# Patient Record
Sex: Male | Born: 1970
Health system: Southern US, Community
[De-identification: ages and names within clinical notes are randomized; demographics above are authoritative.]

## PROBLEM LIST (undated history)

## (undated) DIAGNOSIS — K219 Gastro-esophageal reflux disease without esophagitis: Secondary | ICD-10-CM

## (undated) DIAGNOSIS — E785 Hyperlipidemia, unspecified: Secondary | ICD-10-CM

## (undated) DIAGNOSIS — IMO0002 Reserved for concepts with insufficient information to code with codable children: Secondary | ICD-10-CM

## (undated) HISTORY — DX: Gastro-esophageal reflux disease without esophagitis: K21.9

## (undated) HISTORY — PX: FOOT SURGERY: SHX648

## (undated) HISTORY — DX: Reserved for concepts with insufficient information to code with codable children: IMO0002

## (undated) HISTORY — DX: Hyperlipidemia, unspecified: E78.5

---

## 2004-05-06 ENCOUNTER — Emergency Department (HOSPITAL_COMMUNITY): Admission: EM | Admit: 2004-05-06 | Discharge: 2004-05-06 | Payer: Self-pay | Admitting: Emergency Medicine

## 2010-10-29 ENCOUNTER — Encounter (HOSPITAL_BASED_OUTPATIENT_CLINIC_OR_DEPARTMENT_OTHER): Payer: Self-pay

## 2011-07-06 ENCOUNTER — Emergency Department (HOSPITAL_COMMUNITY): Payer: No Typology Code available for payment source

## 2011-07-06 ENCOUNTER — Encounter (HOSPITAL_COMMUNITY): Payer: Self-pay | Admitting: Emergency Medicine

## 2011-07-06 ENCOUNTER — Emergency Department (HOSPITAL_COMMUNITY)
Admission: EM | Admit: 2011-07-06 | Discharge: 2011-07-07 | Disposition: A | Discharge status: Home / Self Care | Payer: No Typology Code available for payment source | Attending: Emergency Medicine | Admitting: Emergency Medicine

## 2011-07-06 DIAGNOSIS — R071 Chest pain on breathing: Secondary | ICD-10-CM | POA: Diagnosis not present

## 2011-07-06 DIAGNOSIS — IMO0002 Reserved for concepts with insufficient information to code with codable children: Secondary | ICD-10-CM | POA: Diagnosis not present

## 2011-07-06 DIAGNOSIS — S2220XA Unspecified fracture of sternum, initial encounter for closed fracture: Secondary | ICD-10-CM | POA: Diagnosis not present

## 2011-07-06 DIAGNOSIS — R0789 Other chest pain: Secondary | ICD-10-CM

## 2011-07-06 DIAGNOSIS — R1084 Generalized abdominal pain: Secondary | ICD-10-CM | POA: Diagnosis not present

## 2011-07-06 DIAGNOSIS — R110 Nausea: Secondary | ICD-10-CM | POA: Insufficient documentation

## 2011-07-06 DIAGNOSIS — M79609 Pain in unspecified limb: Secondary | ICD-10-CM | POA: Diagnosis not present

## 2011-07-06 DIAGNOSIS — M25539 Pain in unspecified wrist: Secondary | ICD-10-CM | POA: Diagnosis present

## 2011-07-06 LAB — DIFFERENTIAL
Eosinophils Relative: 2 % (ref 0–5)
Lymphocytes Relative: 31 % (ref 12–46)
Lymphs Abs: 3.1 10*3/uL (ref 0.7–4.0)
Monocytes Absolute: 0.7 10*3/uL (ref 0.1–1.0)

## 2011-07-06 LAB — POCT I-STAT, CHEM 8
Calcium, Ion: 0.99 mmol/L — ABNORMAL LOW (ref 1.12–1.32)
Glucose, Bld: 113 mg/dL — ABNORMAL HIGH (ref 70–99)
HCT: 44 % (ref 39.0–52.0)
TCO2: 18 mmol/L (ref 0–100)

## 2011-07-06 LAB — CBC
HCT: 41.3 % (ref 39.0–52.0)
MCV: 85.5 fL (ref 78.0–100.0)
Platelets: 230 10*3/uL (ref 150–400)
RBC: 4.83 MIL/uL (ref 4.22–5.81)
WBC: 9.9 10*3/uL (ref 4.0–10.5)

## 2011-07-06 MED ORDER — ONDANSETRON HCL 4 MG/2ML IJ SOLN
INTRAMUSCULAR | Status: AC
  Administered 2011-07-06: 23:00:00
  Filled 2011-07-06: qty 2

## 2011-07-06 MED ORDER — MORPHINE SULFATE 4 MG/ML IJ SOLN
4.0000 mg | Freq: Once | INTRAMUSCULAR | Status: AC
  Administered 2011-07-06: 4 mg via INTRAVENOUS
  Filled 2011-07-06: qty 1

## 2011-07-06 MED ORDER — SODIUM CHLORIDE 0.9 % IV SOLN
Freq: Once | INTRAVENOUS | Status: AC
  Administered 2011-07-06: via INTRAVENOUS

## 2011-07-06 MED ORDER — SODIUM CHLORIDE 0.9 % IV BOLUS (SEPSIS)
1000.0000 mL | Freq: Once | INTRAVENOUS | Status: AC
  Administered 2011-07-06: 1000 mL via INTRAVENOUS
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
## 2011-07-07 ENCOUNTER — Emergency Department (HOSPITAL_COMMUNITY): Payer: No Typology Code available for payment source

## 2011-07-07 DIAGNOSIS — R071 Chest pain on breathing: Secondary | ICD-10-CM | POA: Diagnosis not present

## 2011-07-07 LAB — URINALYSIS, ROUTINE W REFLEX MICROSCOPIC
Bilirubin Urine: NEGATIVE
Glucose, UA: NEGATIVE mg/dL
Hgb urine dipstick: NEGATIVE
Specific Gravity, Urine: 1.02 (ref 1.005–1.030)
pH: 5.5 (ref 5.0–8.0)

## 2011-07-07 LAB — LACTIC ACID, PLASMA: Lactic Acid, Venous: 2 mmol/L (ref 0.5–2.2)

## 2011-07-07 LAB — RAPID URINE DRUG SCREEN, HOSP PERFORMED
Amphetamines: NOT DETECTED
Barbiturates: NOT DETECTED
Benzodiazepines: NOT DETECTED
Cocaine: NOT DETECTED
Tetrahydrocannabinol: NOT DETECTED

## 2011-07-07 LAB — COMPREHENSIVE METABOLIC PANEL
ALT: 50 U/L (ref 0–53)
Alkaline Phosphatase: 70 U/L (ref 39–117)
CO2: 21 mEq/L (ref 19–32)
Calcium: 9.2 mg/dL (ref 8.4–10.5)
GFR calc Af Amer: >90 mL/min (ref >90)
GFR calc non Af Amer: >90 mL/min (ref >90)
Glucose, Bld: 105 mg/dL — ABNORMAL HIGH (ref 70–99)
Sodium: 141 mEq/L (ref 135–145)
Total Bilirubin: 0.3 mg/dL (ref 0.3–1.2)

## 2011-07-07 MED ORDER — HYDROMORPHONE HCL PF 1 MG/ML IJ SOLN
1.0000 mg | Freq: Once | INTRAMUSCULAR | Status: AC
  Administered 2011-07-07: 1 mg via INTRAVENOUS
  Filled 2011-07-07: qty 1

## 2011-07-07 MED ORDER — IBUPROFEN 800 MG PO TABS: 800.0000 mg | ORAL_TABLET | Freq: Three times a day (TID) | ORAL | Status: AC

## 2011-07-07 MED ORDER — HYDROMORPHONE HCL PF 1 MG/ML IJ SOLN
INTRAMUSCULAR | Status: AC
  Filled 2011-07-07: qty 1

## 2011-07-07 MED ORDER — HYDROCODONE-ACETAMINOPHEN 5-500 MG PO TABS: 1.0000 | ORAL_TABLET | Freq: Four times a day (QID) | ORAL | Status: AC | PRN

## 2011-07-07 MED ORDER — IOHEXOL 300 MG/ML  SOLN
100.0000 mL | Freq: Once | INTRAMUSCULAR | Status: AC | PRN
  Administered 2011-07-07: 100 mL via INTRAVENOUS

## 2011-07-07 MED ORDER — HYDROMORPHONE HCL PF 1 MG/ML IJ SOLN
0.5000 mg | INTRAMUSCULAR | Status: DC
  Administered 2011-07-07: 0.5 mg via INTRAVENOUS
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
## 2011-07-07 NOTE — ED Notes (Signed)
Patient continues with pain in chest and wrist.  MD notified.  New orders per Dr Dierdre Highman.
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
## 2011-07-07 NOTE — ED Notes (Signed)
Patient ambulating independently, pain is better while up and walking.

## 2011-07-08 NOTE — ED Provider Notes (Signed)
I saw and evaluated the patient, reviewed the resident's note and I agree with the findings and plan.   .Face to face Exam:  General:  Awake HEENT:  Atraumatic Resp:  Normal effort Abd:  Nondistended Neuro:No focal weakness Lymph: No adenopathy   Nelia Shi, MD 07/08/11 [phone]

## 2012-06-20 ENCOUNTER — Ambulatory Visit (INDEPENDENT_AMBULATORY_CARE_PROVIDER_SITE_OTHER): Payer: BC Managed Care – PPO | Admitting: Nurse Practitioner

## 2012-06-20 VITALS — BP 126/84 | HR 72 | Temp 98.5°F | Ht 69.0 in | Wt 170.0 lb

## 2012-06-20 DIAGNOSIS — E785 Hyperlipidemia, unspecified: Secondary | ICD-10-CM

## 2012-06-20 DIAGNOSIS — J020 Streptococcal pharyngitis: Secondary | ICD-10-CM

## 2012-06-20 DIAGNOSIS — Z139 Encounter for screening, unspecified: Secondary | ICD-10-CM

## 2012-06-20 LAB — COMPLETE METABOLIC PANEL WITH GFR
CO2: 20 mEq/L (ref 19–32)
Creat: 0.8 mg/dL (ref 0.50–1.35)
GFR, Est African American: >89 mL/min
GFR, Est Non African American: >89 mL/min
Glucose, Bld: 91 mg/dL (ref 70–99)
Total Bilirubin: 0.5 mg/dL (ref 0.3–1.2)

## 2012-06-20 LAB — PSA: PSA: 0.52 ng/mL (ref <=4.00)

## 2012-06-20 NOTE — Progress Notes (Signed)
  Subjective:    Patient ID: Kurt Bowen, male    DOB: 1970/04/10, 42 y.o.   MRN: [id number]  Sore Throat  This is a new problem. The current episode started in the past 7 days. The problem has been gradually worsening. Neither side of throat is experiencing more pain than the other. There has been no fever. The pain is at a severity of 8/10. The pain is severe. Associated symptoms include headaches and trouble swallowing. Pertinent negatives include no congestion, coughing or shortness of breath. He has had no exposure to strep. He has tried nothing for the symptoms.  Hyperlipidemia This is a chronic problem. The current episode started more than 1 month ago. There are no known factors aggravating his hyperlipidemia. Pertinent negatives include no chest pain, leg pain or shortness of breath. He is currently on no antihyperlipidemic treatment. Compliance problems include adherence to diet and adherence to exercise.  Risk factors for coronary artery disease include male sex.      Review of Systems  Constitutional: Negative.   HENT: Positive for trouble swallowing. Negative for congestion.   Eyes: Negative.   Respiratory: Negative for cough and shortness of breath.   Cardiovascular: Negative for chest pain.  Genitourinary: Negative.   Skin: Negative.   Neurological: Positive for headaches.       Objective:   Physical Exam  Constitutional: He appears well-developed and well-nourished.  HENT:  Head: Normocephalic and atraumatic.  Right Ear: External ear normal.  Left Ear: External ear normal.  Eyes: Conjunctivae and EOM are normal. Pupils are equal, round, and reactive to light.  Neck: Normal range of motion. Neck supple. No JVD present. Carotid bruit is not present.  Cardiovascular: Normal rate, regular rhythm and normal heart sounds.   No murmur heard. Pulmonary/Chest: Effort normal and breath sounds normal.  Abdominal: Soft. Bowel sounds are normal.    BP 126/84  Pulse 72   Temp(Src) 98.5 F (36.9 C) (Oral)  Ht 5\' 9"  (1.753 m)  Wt 170 lb (77.111 kg)  BMI 25.09 kg/m2   Results for orders placed in visit on 06/20/12  POCT RAPID STREP A (OFFICE)      Result Value Range   Rapid Strep A Screen Negative  Negative       Assessment & Plan:  1. Pharyngitis Force fluids Motrin or tylenol OTC OTC decongestant Throat lozenges if help New toothbrush in 3 days  - POCT rapid strep A  2. Hyperlipidemia with target LDL less than 100 Low fat diet and exercise Labs pending   3. Screening PSA pending  Mary-Margaret Daphine Deutscher, FNP

## 2012-06-20 NOTE — Patient Instructions (Signed)

## 2012-06-21 ENCOUNTER — Telehealth: Payer: Self-pay | Admitting: Nurse Practitioner

## 2012-06-21 ENCOUNTER — Other Ambulatory Visit: Payer: Self-pay | Admitting: Physician Assistant

## 2012-06-21 DIAGNOSIS — J322 Chronic ethmoidal sinusitis: Secondary | ICD-10-CM

## 2012-06-21 MED ORDER — CIPROFLOXACIN HCL 500 MG PO TABS: 500.0000 mg | ORAL_TABLET | Freq: Two times a day (BID) | ORAL | Status: DC

## 2012-06-21 NOTE — Telephone Encounter (Signed)
Pt.notified

## 2012-06-21 NOTE — Telephone Encounter (Signed)
cipro sent to pharm

## 2012-06-21 NOTE — Telephone Encounter (Signed)
Please advise 

## 2012-06-24 ENCOUNTER — Telehealth: Payer: Self-pay | Admitting: Nurse Practitioner

## 2012-06-24 ENCOUNTER — Other Ambulatory Visit: Payer: Self-pay | Admitting: Nurse Practitioner

## 2012-06-24 LAB — NMR LIPOPROFILE WITH LIPIDS
HDL Size: 8.2 nm — ABNORMAL LOW (ref >=9.2)
HDL-C: 35 mg/dL — ABNORMAL LOW (ref >=40)
LDL (calc): 116 mg/dL — ABNORMAL HIGH (ref <100)
LDL Particle Number: 2257 nmol/L — ABNORMAL HIGH (ref <1000)
LDL Size: 19.7 nm — ABNORMAL LOW (ref >20.5)
LP-IR Score: 57 — ABNORMAL HIGH (ref <=45)

## 2012-06-24 MED ORDER — ATORVASTATIN CALCIUM 40 MG PO TABS: 40.0000 mg | ORAL_TABLET | Freq: Every day | ORAL | Status: DC

## 2012-06-24 NOTE — Progress Notes (Signed)
Patient aware of labs and that rx was sent to the pharmacy

## 2012-06-24 NOTE — Telephone Encounter (Signed)
Taken care of labs were just called into patient the provider had sent rx

## 2012-10-02 ENCOUNTER — Telehealth: Payer: Self-pay | Admitting: Family Medicine

## 2012-10-03 NOTE — Telephone Encounter (Signed)
Appt given

## 2012-10-10 ENCOUNTER — Ambulatory Visit (INDEPENDENT_AMBULATORY_CARE_PROVIDER_SITE_OTHER): Payer: BC Managed Care – PPO | Admitting: *Deleted

## 2012-10-10 DIAGNOSIS — Z23 Encounter for immunization: Secondary | ICD-10-CM

## 2012-10-10 NOTE — Patient Instructions (Signed)
Meningococcal Diphtheria Toxoid Conjugate Vaccine What is this medicine? MENINGOCOCCAL DIPHTHERIA TOXOID CONJUGATE VACCINE (muh ning goh KOK kal dif THEER ee uh TOK soid KON juh geyt vak SEEN) is a vaccine to protect from bacterial meningitis. This vaccine does not contain live bacteria. It will not cause a meningitis. This medicine may be used for other purposes; ask your health care provider or pharmacist if you have questions. What should I tell my health care provider before I take this medicine? They need to know if you have any of these conditions: -bleeding disorder -fever or infection -history of Guillain-Barre syndrome -immune system problems -an unusual or allergic reaction to diphtheria toxoid, meningococcal vaccine, latex, other medicines, foods, dyes, or preservatives -pregnant or trying to get pregnant -breast-feeding How should I use this medicine? This medicine is for injection into a muscle. It is given by a health care professional in a hospital or clinic setting. A copy of Vaccine Information Statements will be given before each vaccination. Read this sheet carefully each time. The sheet may change frequently. Talk to your pediatrician regarding the use of this medicine in children. While some brands of this drug may be prescribed for children as young as 61 months of age for selected conditions, precautions do apply. Overdosage: If you think you have taken too much of this medicine contact a poison control center or emergency room at once. NOTE: This medicine is only for you. Do not share this medicine with others. What if I miss a dose? This does not apply. What may interact with this medicine? -adalimumab -anakinra -infliximab -medicines for organ transplant -medicines to treat cancer -medicines used during some procedures to diagnose a medical condition -other vaccines -some medicines for arthritis -steroid medicines like prednisone or cortisone This list may not  describe all possible interactions. Give your health care provider a list of all the medicines, herbs, non-prescription drugs, or dietary supplements you use. Also tell them if you smoke, drink alcohol, or use illegal drugs. Some items may interact with your medicine. What should I watch for while using this medicine? Report any side effects that are worrisome to your doctor right away. Call your doctor if you have any unusual symptoms within 6 weeks of getting this vaccine. This vaccine may not protect from all meningitis infections. Women should inform their doctor if they wish to become pregnant or think they might be pregnant. Talk to your health care professional or pharmacist for more information. What side effects may I notice from receiving this medicine? Side effects that you should report to your doctor or health care professional as soon as possible: -allergic reactions like skin rash, itching or hives, swelling of the face, lips, or tongue -breathing problems -feeling faint or lightheaded, falls -fever over 102 degrees F -muscle weakness -unusual drooping or paralysis of face  Side effects that usually do not require medical attention (report to your doctor or health care professional if they continue or are bothersome): -chills -diarrhea -headache -loss of appetite -muscle aches and pains -pain at site where injected -tired This list may not describe all possible side effects. Call your doctor for medical advice about side effects. You may report side effects to FDA at 1-800-FDA-1088. Where should I keep my medicine? This drug is given in a hospital or clinic and will not be stored at home. NOTE: This sheet is a summary. It may not cover all possible information. If you have questions about this medicine, talk to your doctor, pharmacist, or  health care provider.  2012, [employer] Standard. (08/02/2009 9:41:10 PM)

## 2012-11-25 ENCOUNTER — Ambulatory Visit (INDEPENDENT_AMBULATORY_CARE_PROVIDER_SITE_OTHER): Payer: BC Managed Care – PPO | Admitting: General Practice

## 2012-11-25 ENCOUNTER — Encounter: Payer: Self-pay | Admitting: General Practice

## 2012-11-25 VITALS — BP 121/77 | HR 69 | Temp 97.4°F | Ht 69.0 in | Wt 159.0 lb

## 2012-11-25 DIAGNOSIS — J322 Chronic ethmoidal sinusitis: Secondary | ICD-10-CM

## 2012-11-25 MED ORDER — AZITHROMYCIN 250 MG PO TABS: ORAL_TABLET | ORAL | Status: DC

## 2012-11-25 NOTE — Progress Notes (Signed)
  Subjective:    Patient ID: Kurt Bowen, male    DOB: 1971-03-23, 42 y.o.   MRN: [id number]  Headache  This is a new problem. The current episode started yesterday. The problem occurs daily. The problem has been unchanged. The pain is located in the frontal region. The pain radiates to the face. The pain quality is not similar to prior headaches. The quality of the pain is described as aching. Associated symptoms include dizziness, ear pain, nausea and sinus pressure. Pertinent negatives include no abdominal pain, coughing, drainage, fever or weakness. Nothing aggravates the symptoms. He has tried nothing for the symptoms.   Presents today with complaints of  nausea that began yesterday and diarrhea times one (Saturday). He denies diarrhea since Saturday.     Review of Systems  Constitutional: Negative for fever and chills.  HENT: Positive for ear pain and sinus pressure.   Respiratory: Negative for cough and chest tightness.   Cardiovascular: Negative for chest pain.  Gastrointestinal: Positive for nausea. Negative for abdominal pain.  Neurological: Positive for dizziness and headaches. Negative for weakness.       Objective:   Physical Exam  Constitutional: He is oriented to person, place, and time. He appears well-developed and well-nourished.  HENT:  Head: Normocephalic and atraumatic.  Right Ear: External ear normal.  Left Ear: External ear normal.  Nose: Right sinus exhibits maxillary sinus tenderness and frontal sinus tenderness. Left sinus exhibits maxillary sinus tenderness and frontal sinus tenderness.  Mouth/Throat: Posterior oropharyngeal erythema present.  Cardiovascular: Normal rate, regular rhythm and normal heart sounds.   Pulmonary/Chest: Effort normal and breath sounds normal.  Neurological: He is alert and oriented to person, place, and time.  Skin: Skin is warm and dry.  Psychiatric: He has a normal mood and affect.          Assessment & Plan:  1.  Ethmoid sinusitis - azithromycin (ZITHROMAX) 250 MG tablet; Take as directed  Dispense: 6 tablet; Refill: 0 -increase fluids -RTO if symptoms worsen or unresolved -Patient verbalized understanding -Coralie Keens, FNP-C

## 2012-11-25 NOTE — Patient Instructions (Signed)

## 2013-01-06 ENCOUNTER — Ambulatory Visit (INDEPENDENT_AMBULATORY_CARE_PROVIDER_SITE_OTHER): Payer: BC Managed Care – PPO | Admitting: General Practice

## 2013-01-06 ENCOUNTER — Encounter (INDEPENDENT_AMBULATORY_CARE_PROVIDER_SITE_OTHER): Payer: Self-pay

## 2013-01-06 VITALS — BP 126/78 | HR 67 | Temp 96.7°F | Ht 69.0 in | Wt 164.0 lb

## 2013-01-06 DIAGNOSIS — J069 Acute upper respiratory infection, unspecified: Secondary | ICD-10-CM

## 2013-01-06 DIAGNOSIS — E785 Hyperlipidemia, unspecified: Secondary | ICD-10-CM

## 2013-01-06 MED ORDER — AZITHROMYCIN 250 MG PO TABS: ORAL_TABLET | ORAL | Status: DC

## 2013-01-06 NOTE — Patient Instructions (Signed)

## 2013-01-06 NOTE — Progress Notes (Signed)
  Subjective:    Patient ID: Kurt Bowen, male    DOB: 1971-01-12, 42 y.o.   MRN: [id number]  Cough This is a new problem. The current episode started in the past 7 days. The problem has been unchanged. The problem occurs hourly. The cough is productive of sputum (greenish sputum). Associated symptoms include chills and nasal congestion. Pertinent negatives include no chest pain, fever, headaches, rhinorrhea, sore throat or shortness of breath. The symptoms are aggravated by lying down. He has tried nothing for the symptoms. There is no history of bronchiectasis, bronchitis or pneumonia.  Patient reports a history of hyperlipidemia, hasn't eaten in past 6 hours, and would like cholesterol checked.      Review of Systems  Constitutional: Positive for chills. Negative for fever.  HENT: Negative for rhinorrhea and sore throat.   Respiratory: Positive for cough. Negative for chest tightness and shortness of breath.   Cardiovascular: Negative for chest pain and palpitations.  Neurological: Negative for dizziness, weakness and headaches.       Objective:   Physical Exam  Constitutional: He is oriented to person, place, and time. He appears well-developed and well-nourished.  HENT:  Head: Normocephalic and atraumatic.  Right Ear: External ear normal.  Left Ear: External ear normal.  Nose: Right sinus exhibits maxillary sinus tenderness. Left sinus exhibits maxillary sinus tenderness.  Mouth/Throat: Posterior oropharyngeal erythema present.  Eyes: EOM are normal. Pupils are equal, round, and reactive to light.  Neck: Normal range of motion. Neck supple. No thyromegaly present.  Cardiovascular: Normal rate, regular rhythm and normal heart sounds.   Pulmonary/Chest: Effort normal and breath sounds normal. No respiratory distress. He exhibits no tenderness.  Lymphadenopathy:    He has no cervical adenopathy.  Neurological: He is alert and oriented to person, place, and time.  Skin: Skin is  warm and dry.  Psychiatric: He has a normal mood and affect.          Assessment & Plan:  1. Acute upper respiratory infections of unspecified site  - azithromycin (ZITHROMAX) 250 MG tablet; Take as directed  Dispense: 6 tablet; Refill: 0 -Increase fluid intake Motrin or tylenol OTC OTC decongestant Throat lozenges if help New toothbrush in 3 days Proper handwashing Patient verbalized understanding Coralie Keens, FNP-C   2. Other and unspecified hyperlipidemia  - NMR, lipoprofile

## 2013-01-07 LAB — NMR, LIPOPROFILE
LDL Particle Number: 2041 nmol/L — ABNORMAL HIGH (ref <1000)
LDL Size: 19.8 nm — ABNORMAL LOW (ref >20.5)
LP-IR Score: 69 — ABNORMAL HIGH (ref <=45)

## 2013-04-22 ENCOUNTER — Ambulatory Visit (INDEPENDENT_AMBULATORY_CARE_PROVIDER_SITE_OTHER): Payer: BC Managed Care – PPO | Admitting: Family Medicine

## 2013-04-22 ENCOUNTER — Encounter: Payer: Self-pay | Admitting: Family Medicine

## 2013-04-22 VITALS — BP 128/81 | HR 64 | Temp 96.0°F | Ht 69.0 in | Wt 176.0 lb

## 2013-04-22 DIAGNOSIS — L723 Sebaceous cyst: Secondary | ICD-10-CM

## 2013-04-22 NOTE — Patient Instructions (Signed)

## 2013-04-22 NOTE — Progress Notes (Signed)
   Subjective:    Patient ID: Kurt Bowen, male    DOB: [birth year], 43 y.o.   MRN: [id number]  HPI This 43 y.o. male presents for evaluation of mass on his back.   Review of Systems C/o skin mass   No chest pain, SOB, HA, dizziness, vision change, N/V, diarrhea, constipation, dysuria, urinary urgency or frequency, myalgias, arthralgias or rash.  Objective:   Physical Exam  Skin - Left upper back with large sebaceous cyst.      Assessment & Plan:  Sebaceous cyst - Plan: Ambulatory referral to Dermatology  Deatra CanterWilliam J Monque Haggar FNP
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
## 2014-11-20 ENCOUNTER — Encounter: Payer: Self-pay | Admitting: Nurse Practitioner

## 2014-11-20 ENCOUNTER — Ambulatory Visit (INDEPENDENT_AMBULATORY_CARE_PROVIDER_SITE_OTHER): Payer: BC Managed Care – PPO | Admitting: Nurse Practitioner

## 2014-11-20 VITALS — BP 147/91 | HR 92 | Temp 98.1°F | Ht 69.0 in | Wt 173.8 lb

## 2014-11-20 DIAGNOSIS — M25572 Pain in left ankle and joints of left foot: Secondary | ICD-10-CM
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
## 2015-05-11 ENCOUNTER — Encounter: Payer: Self-pay | Admitting: Family

## 2015-05-11 ENCOUNTER — Ambulatory Visit (INDEPENDENT_AMBULATORY_CARE_PROVIDER_SITE_OTHER): Payer: BC Managed Care – PPO | Admitting: Family

## 2015-05-11 VITALS — BP 133/88 | HR 109 | Temp 101.4°F | Ht 69.0 in | Wt 177.8 lb

## 2015-05-11 DIAGNOSIS — J101 Influenza due to other identified influenza virus with other respiratory manifestations: Secondary | ICD-10-CM

## 2015-05-11 DIAGNOSIS — R6889 Other general symptoms and signs: Secondary | ICD-10-CM | POA: Diagnosis not present

## 2015-05-11 LAB — POCT RAPID STREP A (OFFICE): RAPID STREP A SCREEN: NEGATIVE

## 2015-05-11 LAB — POCT INFLUENZA A/B
Influenza A, POC: NEGATIVE
Influenza B, POC: POSITIVE — AB

## 2015-05-11 MED ORDER — OSELTAMIVIR PHOSPHATE 75 MG PO CAPS: 75.0000 mg | ORAL_CAPSULE | Freq: Two times a day (BID) | ORAL | Status: DC

## 2015-05-11 NOTE — Patient Instructions (Signed)

## 2015-05-11 NOTE — Progress Notes (Signed)
   Subjective:    Patient ID: Kurt Bowen, male    DOB: 09-May-1970, 45 y.o.   MRN: [id number]  Fever  This is a new problem. The current episode started in the past 7 days (Sunday). The maximum temperature noted was 101 to 101.9 F. Associated symptoms include coughing, headaches, muscle aches, sleepiness and a sore throat. Pertinent negatives include no congestion, diarrhea, ear pain, nausea or vomiting. He has tried acetaminophen, NSAIDs and fluids for the symptoms. The treatment provided mild relief.  Cough Associated symptoms include a fever, headaches and a sore throat. Pertinent negatives include no ear pain.  Headache  This is a new problem. Associated symptoms include coughing, a fever, muscle aches and a sore throat. Pertinent negatives include no ear pain, nausea or vomiting.      Review of Systems  Constitutional: Positive for fever.  HENT: Positive for sore throat. Negative for congestion and ear pain.   Respiratory: Positive for cough.   Cardiovascular: Negative.   Gastrointestinal: Negative.  Negative for nausea, vomiting and diarrhea.  Endocrine: Negative.   Genitourinary: Negative.   Musculoskeletal: Negative.   Neurological: Positive for headaches.  Hematological: Negative.   Psychiatric/Behavioral: Negative.   All other systems reviewed and are negative.      Objective:   Physical Exam  Constitutional: He is oriented to person, place, and time. He appears well-developed and well-nourished. He appears ill. No distress.  HENT:  Head: Normocephalic.  Right Ear: External ear normal.  Left Ear: External ear normal.  Mouth/Throat: Oropharynx is clear and moist.  Eyes: Pupils are equal, round, and reactive to light. Right eye exhibits no discharge. Left eye exhibits no discharge.  Neck: Normal range of motion. Neck supple. No thyromegaly present.  Cardiovascular: Normal rate, regular rhythm, normal heart sounds and intact distal pulses.   No murmur  heard. Pulmonary/Chest: Effort normal and breath sounds normal. No respiratory distress. He has no wheezes.  Abdominal: Soft. Bowel sounds are normal. He exhibits no distension. There is no tenderness.  Musculoskeletal: Normal range of motion. He exhibits no edema or tenderness.  Neurological: He is alert and oriented to person, place, and time. He has normal reflexes. No cranial nerve deficit.  Skin: Skin is warm and dry. No rash noted. No erythema.  Psychiatric: He has a normal mood and affect. His behavior is normal. Judgment and thought content normal.  Vitals reviewed.   BP 133/88 mmHg  Pulse 109  Temp(Src) 101.4 F (38.6 C) (Oral)  Ht  (1.753 m)  Wt 177 lb 12.8 oz (80.65 kg)  BMI 26.24 kg/m2  Results for orders placed or performed in visit on 05/11/15  POCT Influenza A/B  Result Value Ref Range   Influenza A, POC Negative Negative   Influenza B, POC Positive (A) Negative  POCT rapid strep A  Result Value Ref Range   Rapid Strep A Screen Negative Negative        Assessment & Plan:  1. Flu-like symptoms - POCT Influenza A/B - POCT rapid strep A  2. Influenza B -Rest -Force fluids -Tylenol prn for fever and aches - RTO prn  - oseltamivir (TAMIFLU) 75 MG capsule; Take 1 capsule (75 mg total) by mouth 2 (two) times daily.  Dispense: 10 capsule; Refill: 0  Jannifer Rodney, FNP

## 2015-07-11 ENCOUNTER — Ambulatory Visit (INDEPENDENT_AMBULATORY_CARE_PROVIDER_SITE_OTHER): Payer: BC Managed Care – PPO | Admitting: Family Medicine

## 2015-07-11 ENCOUNTER — Encounter: Payer: Self-pay | Admitting: Family Medicine

## 2015-07-11 VITALS — BP 121/85 | HR 89 | Temp 98.1°F | Ht 69.0 in | Wt 169.8 lb

## 2015-07-11 DIAGNOSIS — J020 Streptococcal pharyngitis: Secondary | ICD-10-CM

## 2015-07-11 DIAGNOSIS — J029 Acute pharyngitis, unspecified: Secondary | ICD-10-CM

## 2015-07-11 LAB — RAPID STREP SCREEN (MED CTR MEBANE ONLY): STREP GP A AG, IA W/REFLEX: POSITIVE — AB

## 2015-07-11 MED ORDER — AMOXICILLIN 500 MG PO CAPS: 500.0000 mg | ORAL_CAPSULE | Freq: Two times a day (BID) | ORAL | Status: DC

## 2015-07-11 NOTE — Progress Notes (Signed)
BP 121/85 mmHg  Pulse 89  Temp(Src) 98.1 F (36.7 C) (Oral)  Ht 5\' 9"  (1.753 m)  Wt 169 lb 12.8 oz (77.021 kg)  BMI 25.06 kg/m2   Subjective:    Patient ID: Kurt Bowen, male    DOB: 15-Apr-1970, 45 y.o.   MRN: [id number]  HPI: Kurt LedererHesham Foos is a 45 y.o. male presenting on 07/11/2015 for Sinusitis; Cough; and Sore Throat   HPI Sore throat and congestion and cough Patient has been having sore throat and congestion and cough that has been going on the past 2 days. He is also been having sweats and fevers overnight the past couple days as well. He denies any shortness of breath or wheezing. His cough has been productive of yellow-green sputum. He is also started having some sinus congestion especially on the right side and in the right ear ingestion as well. He denies any shortness of breath or wheezing.  Relevant past medical, surgical, family and social history reviewed and updated as indicated. Interim medical history since our last visit reviewed. Allergies and medications reviewed and updated.  Review of Systems  Constitutional: Positive for fever. Negative for chills.  HENT: Positive for congestion, postnasal drip, rhinorrhea, sinus pressure, sneezing and sore throat. Negative for ear discharge, ear pain and voice change.   Eyes: Negative for pain, discharge, redness and visual disturbance.  Respiratory: Positive for cough. Negative for chest tightness, shortness of breath and wheezing.   Cardiovascular: Negative for chest pain and leg swelling.  Gastrointestinal: Negative for abdominal pain, diarrhea and constipation.  Genitourinary: Negative for difficulty urinating.  Musculoskeletal: Negative for back pain and gait problem.  Skin: Negative for rash.  Neurological: Negative for syncope, light-headedness and headaches.  All other systems reviewed and are negative.   Per HPI unless specifically indicated above     Medication List       This list is accurate as  of: 07/11/15  3:07 PM.  Always use your most recent med list.               amoxicillin 500 MG capsule  Commonly known as:  AMOXIL  Take 1 capsule (500 mg total) by mouth 2 (two) times daily.           Objective:    BP 121/85 mmHg  Pulse 89  Temp(Src) 98.1 F (36.7 C) (Oral)  Ht 5\' 9"  (1.753 m)  Wt 169 lb 12.8 oz (77.021 kg)  BMI 25.06 kg/m2  Wt Readings from Last 3 Encounters:  07/11/15 169 lb 12.8 oz (77.021 kg)  05/11/15 177 lb 12.8 oz (80.65 kg)  11/20/14 173 lb 12.8 oz (78.835 kg)    Physical Exam  Constitutional: He is oriented to person, place, and time. He appears well-developed and well-nourished. No distress.  HENT:  Right Ear: Tympanic membrane, external ear and ear canal normal.  Left Ear: Tympanic membrane, external ear and ear canal normal.  Nose: Mucosal edema and rhinorrhea present. No sinus tenderness. No epistaxis. Right sinus exhibits maxillary sinus tenderness. Right sinus exhibits no frontal sinus tenderness. Left sinus exhibits maxillary sinus tenderness. Left sinus exhibits no frontal sinus tenderness.  Mouth/Throat: Uvula is midline and mucous membranes are normal. Posterior oropharyngeal edema and posterior oropharyngeal erythema present. No oropharyngeal exudate or tonsillar abscesses.  Eyes: Conjunctivae and EOM are normal. Pupils are equal, round, and reactive to light. Right eye exhibits no discharge. No scleral icterus.  Neck: Neck supple. No thyromegaly present.  Cardiovascular: Normal rate, regular rhythm, normal  heart sounds and intact distal pulses.   No murmur heard. Pulmonary/Chest: Effort normal and breath sounds normal. No respiratory distress. He has no wheezes. He has no rales.  Musculoskeletal: Normal range of motion. He exhibits no edema.  Lymphadenopathy:    He has no cervical adenopathy.  Neurological: He is alert and oriented to person, place, and time. Coordination normal.  Skin: Skin is warm and dry. No rash noted. He is not  diaphoretic.  Psychiatric: He has a normal mood and affect. His behavior is normal.  Nursing note and vitals reviewed.   Rapid strep: Positive    Assessment & Plan:       Problem List Items Addressed This Visit    None    Visit Diagnoses    Sore throat    -  Primary    Relevant Medications    amoxicillin (AMOXIL) 500 MG capsule    Other Relevant Orders    Rapid strep screen (not at [facility])    Strep pharyngitis        Relevant Medications    amoxicillin (AMOXIL) 500 MG capsule        Follow up plan: Return if symptoms worsen or fail to improve.  Counseling provided for all of the vaccine components Orders Placed This Encounter  Procedures  . Rapid strep screen (not at [facility])    Arville Care, MD [facility] 07/11/2015, 3:07 PM

## 2015-11-24 ENCOUNTER — Telehealth: Payer: Self-pay | Admitting: Family

## 2015-12-02 NOTE — Telephone Encounter (Signed)
Denied.

## 2018-01-03 ENCOUNTER — Ambulatory Visit: Payer: Medicaid Other | Admitting: Family Medicine

## 2018-01-03 ENCOUNTER — Encounter: Payer: Self-pay | Admitting: Family Medicine

## 2018-01-03 VITALS — BP 122/81 | HR 79 | Temp 97.8°F | Ht 69.0 in | Wt 185.8 lb

## 2018-01-03 DIAGNOSIS — Z0001 Encounter for general adult medical examination with abnormal findings: Secondary | ICD-10-CM

## 2018-01-03 DIAGNOSIS — L918 Other hypertrophic disorders of the skin: Secondary | ICD-10-CM

## 2018-01-03 DIAGNOSIS — Z0000 Encounter for general adult medical examination without abnormal findings: Secondary | ICD-10-CM

## 2018-01-03 LAB — CBC WITH DIFFERENTIAL/PLATELET
Basophils Absolute: 0.1 10*3/uL (ref 0.0–0.2)
Basos: 1 %
EOS (ABSOLUTE): 0.1 10*3/uL (ref 0.0–0.4)
EOS: 2 %
HEMATOCRIT: 47.1 % (ref 37.5–51.0)
Hemoglobin: 15.7 g/dL (ref 13.0–17.7)
IMMATURE GRANS (ABS): 0 10*3/uL (ref 0.0–0.1)
IMMATURE GRANULOCYTES: 0 %
LYMPHS: 37 %
Lymphocytes Absolute: 2.1 10*3/uL (ref 0.7–3.1)
MCH: 29.3 pg (ref 26.6–33.0)
MCHC: 33.3 g/dL (ref 31.5–35.7)
MCV: 88 fL (ref 79–97)
MONOS ABS: 0.5 10*3/uL (ref 0.1–0.9)
Monocytes: 9 %
NEUTROS PCT: 51 %
Neutrophils Absolute: 3 10*3/uL (ref 1.4–7.0)
Platelets: 282 10*3/uL (ref 150–450)
RBC: 5.36 x10E6/uL (ref 4.14–5.80)
RDW: 13.2 % (ref 12.3–15.4)
WBC: 5.8 10*3/uL (ref 3.4–10.8)

## 2018-01-03 LAB — CMP14+EGFR
A/G RATIO: 2.2 (ref 1.2–2.2)
ALK PHOS: 88 IU/L (ref 39–117)
ALT: 48 IU/L — ABNORMAL HIGH (ref 0–44)
AST: 24 IU/L (ref 0–40)
Albumin: 4.8 g/dL (ref 3.5–5.5)
BUN/Creatinine Ratio: 19 (ref 9–20)
BUN: 17 mg/dL (ref 6–24)
Bilirubin Total: 0.2 mg/dL (ref 0.0–1.2)
CALCIUM: 9.6 mg/dL (ref 8.7–10.2)
CHLORIDE: 105 mmol/L (ref 96–106)
CO2: 21 mmol/L (ref 20–29)
Creatinine, Ser: 0.89 mg/dL (ref 0.76–1.27)
GFR calc Af Amer: 119 mL/min/{1.73_m2} (ref >59)
GFR calc non Af Amer: 103 mL/min/{1.73_m2} (ref >59)
Globulin, Total: 2.2 g/dL (ref 1.5–4.5)
Glucose: 80 mg/dL (ref 65–99)
POTASSIUM: 4.4 mmol/L (ref 3.5–5.2)
SODIUM: 140 mmol/L (ref 134–144)
Total Protein: 7 g/dL (ref 6.0–8.5)

## 2018-01-03 LAB — LIPID PANEL
CHOLESTEROL TOTAL: 196 mg/dL (ref 100–199)
Chol/HDL Ratio: 7.3 ratio — ABNORMAL HIGH (ref 0.0–5.0)
HDL: 27 mg/dL — ABNORMAL LOW (ref >39)
LDL Calculated: 123 mg/dL — ABNORMAL HIGH (ref 0–99)
TRIGLYCERIDES: 228 mg/dL — AB (ref 0–149)
VLDL Cholesterol Cal: 46 mg/dL — ABNORMAL HIGH (ref 5–40)

## 2018-01-03 NOTE — Progress Notes (Signed)
BP 122/81   Pulse 79   Temp 97.8 F (36.6 C) (Oral)   Ht _0  (1.753 m)   Wt 185 lb 12.8 oz (84.3 kg)   BMI 27.44 kg/m    Subjective:    Patient ID: Kurt Bowen, male    DOB: [birth year], 47 y.o.   MRN: [id number]  HPI: Kurt Bowen is a 47 y.o. male presenting on 01/03/2018 for Medical Management of Chronic Issues (wants lab work done) and wart removal (on back and axillary)   HPI Adult well exam and cholesterol recheck Patient is coming in to reestablish care and get an adult well exam and cholesterol recheck.  He says is been almost 3 years since he had any of this checked and he had lost insurance for a little bit less weight is not coming.  He denies any major health issues except fatigue and decreased energy.  He says he wakes up in the morning he just does not feel like he has any energy and sometimes through the day he does feel like he wants to nap although he forces himself to not do that.  He does admit that he is a heavy snorer and his wife has complained of that and especially while he is just sitting or relaxing he says he will even snore sometimes sitting or relaxing while he is awake.  We discussed sleep apnea but patient declines doing a sleep study currently but wants to focus on losing weight and changing his diet which she has done previously and then he will return if he still having issues. Patient denies any chest pain, shortness of breath, headaches or vision issues, abdominal complaints, diarrhea, nausea, vomiting, or joint issues.   Relevant past medical, surgical, family and social history reviewed and updated as indicated. Interim medical history since our last visit reviewed. Allergies and medications reviewed and updated.  Review of Systems  Constitutional: Negative for chills and fever.  HENT: Negative for ear pain and tinnitus.   Eyes: Negative for visual disturbance.  Respiratory: Negative for cough, shortness of breath and wheezing.     Cardiovascular: Negative for chest pain, palpitations and leg swelling.  Gastrointestinal: Negative for abdominal pain, blood in stool, constipation and diarrhea.  Genitourinary: Negative for dysuria and hematuria.  Musculoskeletal: Negative for back pain, gait problem and myalgias.  Skin: Negative for rash.  Neurological: Negative for dizziness, weakness and headaches.  Psychiatric/Behavioral: Negative for suicidal ideas.  All other systems reviewed and are negative.   Per HPI unless specifically indicated above   Allergies as of 01/03/2018   No Known Allergies     Medication List    as of 01/03/2018 11:23 AM   You have not been prescribed any medications.        Objective:    BP 122/81   Pulse 79   Temp 97.8 F (36.6 C) (Oral)   Ht _1  (1.753 m)   Wt 185 lb 12.8 oz (84.3 kg)   BMI 27.44 kg/m   Wt Readings from Last 3 Encounters:  01/03/18 185 lb 12.8 oz (84.3 kg)  07/11/15 169 lb 12.8 oz (77 kg)  05/11/15 177 lb 12.8 oz (80.6 kg)    Physical Exam  Constitutional: He is oriented to person, place, and time. He appears well-developed and well-nourished. No distress.  HENT:  Right Ear: External ear normal.  Left Ear: External ear normal.  Nose: Nose normal.  Mouth/Throat: Oropharynx is clear and moist. No oropharyngeal exudate.  Eyes: Conjunctivae  are normal. No scleral icterus.  Neck: Neck supple. No thyromegaly present.  Cardiovascular: Normal rate, regular rhythm, normal heart sounds and intact distal pulses.  No murmur heard. Pulmonary/Chest: Effort normal and breath sounds normal. No respiratory distress. He has no wheezes.  Abdominal: Soft. Bowel sounds are normal. He exhibits no distension. There is no tenderness. There is no rebound and no guarding.  Musculoskeletal: Normal range of motion. He exhibits no edema.  Lymphadenopathy:    He has no cervical adenopathy.  Neurological: He is alert and oriented to person, place, and time. Coordination normal.   Skin: Skin is warm and dry. No rash noted. He is not diaphoretic.  Patient has 5 skin tags, 2 small ones on her left axilla and one small one under right axilla and a large one on left upper back, patient reports inflammation and irritation around the one on his back  Psychiatric: He has a normal mood and affect. His behavior is normal.  Nursing note and vitals reviewed.   Skin tag removal: Using forceps and scissors excised 1skin tag. Used silver nitrate stick for hemostasis and simple bandage with topical antibiotic. Patient tolerated well and had minimal bleeding.     Assessment & Plan:   Problem List Items Addressed This Visit    None    Visit Diagnoses    Well adult exam    -  Primary   Relevant Orders   CBC with Differential/Platelet   CMP14+EGFR   Lipid panel   Inflamed skin tag       Patient says he frequently would get a lot of pruritus and irritation around it, on upper left back       Follow up plan: Return in about 6 months (around 07/05/2018), or if symptoms worsen or fail to improve, for Cholesterol recheck.  Counseling provided for all of the vaccine components Orders Placed This Encounter  Procedures  . CBC with Differential/Platelet  . CMP14+EGFR  . Lipid panel    Caryl Pina, MD [facility] 01/03/2018, 11:23 AM

## 2018-02-12 ENCOUNTER — Telehealth: Payer: Self-pay | Admitting: Family Medicine

## 2018-02-12 ENCOUNTER — Other Ambulatory Visit: Payer: Self-pay

## 2018-02-12 DIAGNOSIS — G4710 Hypersomnia, unspecified: Secondary | ICD-10-CM

## 2018-02-12 DIAGNOSIS — R0683 Snoring: Secondary | ICD-10-CM

## 2018-02-12 NOTE — Telephone Encounter (Signed)
Yes go ahead and set up for patient, sleep center referral and diagnosis of hypersomnia and/or snoring

## 2018-02-12 NOTE — Telephone Encounter (Signed)
Is this ok to order? Please advise 

## 2018-02-12 NOTE — Telephone Encounter (Signed)
Patient notified that referral has been placed and we would call with an appt. Patient verbalized understanding

## 2018-02-13 DIAGNOSIS — H5213 Myopia, bilateral: Secondary | ICD-10-CM | POA: Diagnosis not present

## 2018-03-27 DIAGNOSIS — H5213 Myopia, bilateral: Secondary | ICD-10-CM | POA: Diagnosis not present

## 2018-06-11 ENCOUNTER — Telehealth: Payer: Self-pay | Admitting: Family Medicine

## 2018-06-11 DIAGNOSIS — R0683 Snoring: Secondary | ICD-10-CM

## 2018-06-11 DIAGNOSIS — G4710 Hypersomnia, unspecified: Secondary | ICD-10-CM

## 2018-06-11 NOTE — Telephone Encounter (Signed)
Go ahead and do a referral for sleep testing [facility], do a referral to the sleep center

## 2018-06-11 NOTE — Telephone Encounter (Signed)
PT states he was seen a while back and a sleep apena test was recommenced he is wanting to know who we would suggest

## 2018-06-23 ENCOUNTER — Institutional Professional Consult (permissible substitution): Payer: Medicaid Other | Admitting: Neurology

## 2018-06-23 ENCOUNTER — Encounter: Payer: Self-pay | Admitting: Neurology

## 2018-06-23 ENCOUNTER — Telehealth: Payer: Self-pay | Admitting: Neurology

## 2018-06-23 ENCOUNTER — Other Ambulatory Visit: Payer: Self-pay

## 2018-06-23 ENCOUNTER — Ambulatory Visit (INDEPENDENT_AMBULATORY_CARE_PROVIDER_SITE_OTHER): Payer: Medicaid Other | Admitting: Neurology

## 2018-06-23 DIAGNOSIS — G4700 Insomnia, unspecified: Secondary | ICD-10-CM | POA: Insufficient documentation

## 2018-06-23 DIAGNOSIS — G4719 Other hypersomnia: Secondary | ICD-10-CM | POA: Diagnosis not present

## 2018-06-23 DIAGNOSIS — Z72820 Sleep deprivation: Secondary | ICD-10-CM | POA: Insufficient documentation

## 2018-06-23 DIAGNOSIS — J342 Deviated nasal septum: Secondary | ICD-10-CM | POA: Diagnosis not present

## 2018-06-23 DIAGNOSIS — R0683 Snoring: Secondary | ICD-10-CM | POA: Diagnosis not present

## 2018-06-23 DIAGNOSIS — R0689 Other abnormalities of breathing: Secondary | ICD-10-CM

## 2018-06-23 MED ORDER — MOMETASONE FUROATE 50 MCG/ACT NA SUSP: 2.0000 | Freq: Every day | NASAL | 2 refills | Status: DC

## 2018-06-23 NOTE — Telephone Encounter (Signed)
Pt's email is [email or web address]. Pt understands that the [employer] webex software must be downloaded and operational on the device pt plans to use for the visit. Patient gave verbal consent for the virtual visit. I have reviewed  the patients chart and made sure everything is up to date.

## 2018-06-23 NOTE — Progress Notes (Signed)
Virtual Visit via Video Note  I connected with Kurt Bowen on 06/23/18 at 12:30 PM EDT by a video enabled telemedicine application and verified that I am speaking with the correct person using two identifiers.   I discussed the limitations of evaluation and management by telemedicine and the availability of in person appointments. The patient expressed understanding and agreed to proceed.   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 Louanne Bowen for a sleep evaluation.   Chief complaint according to patient : The patient reports that he has been suffering from poor sleep quality, fragmented sleep, sleep deprivation and excessive daytime sleepiness.  His wife has complained about his snoring being so loud that she can hear it in another room.  The patient estimates that he gets only about 4 hours total sleep time at night and that he still has 2 if not 3 bathroom breaks each night. He stated that his weight has been stable for many years, he is a non-smoker and  nondrinker and does not tolerate caffeine.  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  He reported that he was snoring loudly sometimes gasping for breath as his wife has noted, sometimes waking with chest tightness.  His wife has slept in another room on several occasions.  The patient usually has a 1 hour commute and work ends at 8 PM dinner will therefore be late between 9 and 10 PM and bedtime is between 10 and 11 PM or thereabouts.  He describes his bedroom as cool, quiet, and dark and he prefers to sleep on his sides usually with 2 pillows for head support.  He will fall asleep but only stay asleep for 2 sometimes 3 hours and then has to go to the bathroom  after he wakes up he is unable to get easily back to sleep and sometimes stays awake for hours, he also reports that his nasal congestion contributes to sinus headaches and a pressure sensation which he describes as painful in the right maxillary and ethmoidal sinuses.  Somewhere between 6 and 7 AM he is usually able to get back to sleep for another 2 hours.  He does not recall dreaming at night at all he sometimes wakes up with a dry mouth often with nasal congestion and sometimes with headaches that are resolving by themselves without any medication to be taken.  His wife also has not noted any kind of dream enactment sleep talking sleepwalking etc.  He estimates the total sleep time to be between 4 and 5 hours at the most.  Sometimes, he has an opportunity to take a brief power nap and he feels that 10 to 30-minute naps are refreshing.   Medical history; the patient has a history of elevated cholesterol, he is not diabetic, not short of breath, and he has no history of diagnosed heart disease, thyroid disease traumatic brain injuries or neck injuries such as whiplash.  Family ( sleep) history: Maternal grandmother had diabetes.  He is unaware of anybody else having possibly sleep apnea.   Social history: The patient is married with 2 toddlers, 362 and 47 years old, he works as a Automotive engineerdriver start working at [employer] finishes  working at 8 PM and commutes for about an hour.  He does not use tobacco products but used to before he got married and had children, he does not drink alcohol ever, he does not drink caffeine.    Review of Systems: Out of a complete 14 system review, the patient complains of only the following symptoms, and all other reviewed systems are negative. How likely are you to doze in the following situations: 0 = not likely, 1 = slight chance, 2 = moderate chance, 3 = high chance  Sitting and Reading? Watching Television? Sitting inactive in a public place (theater or meeting)? Lying down in  the afternoon when circumstances permit? Sitting and talking to someone? Sitting quietly after lunch without alcohol? In a car, while stopped for a few minutes in traffic? As a passenger in a car for an hour without a break?  Total = 15/ 24 points     Fatigue severity score deferred.  Sleep deprived, nocuria, insomnia, snoring, gasping. Non restorative sleep.   Social History   Socioeconomic History   Marital status: Single    Spouse name: Not on file   Number of children: Not on file   Years of education: Not on file   Highest education level: Not on file  Occupational History   Not on file  Social Needs   Financial resource strain: Not on file   Food insecurity:    Worry: Not on file    Inability: Not on file   Transportation needs:    Medical: Not on file    Non-medical: Not on file  Tobacco Use   Smoking status: Former Smoker    Types: Cigarettes    Last attempt to quit: 2008    Years since quitting: 12.2   Smokeless tobacco: Never Used  Substance and Sexual Activity   Alcohol use: No   Drug use: No   Sexual activity: Not on file  Lifestyle   Physical activity:    Days per week: Not on file    Minutes per session: Not on file   Stress: Not on file  Relationships   Social connections:    Talks on phone: Not on file    Gets together: Not on file    Attends religious service: Not on file    Active member of club or organization: Not on file    Attends meetings of clubs or organizations: Not on file    Relationship status: Not on file   Intimate partner violence:    Fear of current or ex partner: Not on file    Emotionally abused: Not on file    Physically abused: Not on file    Forced sexual activity: Not on file  Other Topics Concern   Not on file  Social History Narrative   Not on file    Family History  Problem Relation Age of Onset   Hyperlipidemia Father   Maternal GM with DM.   Past Medical History:  Diagnosis Date    GERD (gastroesophageal reflux disease)    Hyperlipidemia    Ulcer     Past Surgical History:  Procedure Laterality Date   FOOT SURGERY     left      Allergies as of 06/23/2018   (No Known Allergies)    Vitals: There were no vitals taken for this visit. Last Weight:  Wt Readings from Last 1 Encounters:  01/03/18 185 lb 12.8 oz (84.3 kg)   CVK:FMMCR is no height or weight  on file to calculate BMI.     Last Height:   Ht Readings from Last 1 Encounters:  01/03/18  (1.753 m)    Physical exam:  General: The patient is awake, alert and appears not in acute distress. The patient is well groomed. Head: Normocephalic, atraumatic. Neck is supple. Mallampati 5  neck circumference: 1825". Nasal airflow only patent on the left - . Retrognathia is seen.  Trunk: BMI is calculated at 28 kg/m2 .  Neurologic exam : The patient is awake and alert, oriented to place and time.   Attention span & concentration ability appears normal.  Speech is fluent,  without  dysarthria, dysphonia or aphasia.  Mood and affect are appropriate.  Cranial nerves: Extraocular movements  in vertical and horizontal planes intact and without nystagmus. VHearing to finger rub intact.  Facial motor strength is symmetric and tongue and uvula move midline. Shoulder shrug was symmetrical.     Assessment and Plan:  Dear Dr. Louanne Skye , Thank you for allowing me to participate in Kurt Bowen's care; As you know, this 48 year old  patient has a history of nonrestorative sleep that seems to have dated back several years.  There was no associated weight or BMI change.  His snoring seems to have gotten louder and his wife has moved into a different bedroom some nights.  His excessive daytime sleepiness has exacerbated as has the volume of his snoring and witnessed gasping for air at night.   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  Obstructive sleep apnea is  almost certainly present but there is also a history of insomnia.  The patient sleeps into intervals he may go to bed by 10 and may sleep for 2-1/2 hours then stays awake in the middle of the night until 7 AM when he can get another 2 hours of morning sleep.   He feels that the sleep in the morning is more refreshing and more restorative than the nocturnal sleep and he stated the same for power naps.  He is not aware of vivid dreams but he has sometimes dreamt in a nap.  I consider this patient chronically sleep deprived and excessively daytime sleepy and in order to help him improve his nasal patency I will order Nasonex or Nasacort depending on insurance coverage.  Should nasal spray not help his sinus and nasal air flow, will send to ENT. There is nasal septal deviation present.   In addition I will order a home sleep test to confirm apnea and to initiate treatment as soon as possible.  This is especially important in light of the patient's profession, he drives for living.    I discussed the assessment and treatment plan with the patient. The patient was provided an opportunity to ask questions and all were answered. The patient agreed with the plan and demonstrated an understanding of the instructions.   The patient was advised to call back or seek an in-person evaluation if the symptoms worsen or if the condition fails to improve as anticipated.  I provided over 35 minutes of preparation, review of Records and Labs,  and non-face-to-face time during this encounter.   Nasocort and HST - nasal patency and sinus drainage to be improved.  If OSA is confirmed and PAP is appropriate , I will order CPAP therapy   Kurt Novas, MD 06/23/2018, 1:01 PM  Certified in Neurology by ABPN Certified in Sleep Medicine by [facility]  [address] [place], [place] [address]

## 2018-06-23 NOTE — Telephone Encounter (Signed)
Due to current COVID 19 pandemic, our office is severely reducing in office visits for at least the next 2 weeks, in order to minimize the risk to our patients and healthcare providers. Our staff will contact you for next steps.  Pt understands that although there may be some limitations with this type of visit, we will take all precautions to reduce any security or privacy concerns.  Pt understands that this will be treated like an in office visit and we will file with pt's insurance, and there may be a patient responsible charge related to this service. ° ° °

## 2018-06-23 NOTE — Patient Instructions (Signed)
Please remember to try to maintain good sleep hygiene, which means: Keep a regular sleep and wake schedule, try not to exercise or have a meal within 2 hours of your bedtime, try to keep your bedroom conducive for sleep, that is, cool and dark, without light distractors such as an illuminated alarm clock, and refrain from watching TV right before sleep or in the middle of the night and do not keep the TV or radio on during the night. Also, try not to use or play on electronic devices at bedtime, such as your cell phone, tablet PC or laptop. If you like to read at bedtime on an electronic device, try to dim the background light as much as possible. Do not eat in the middle of the night.   We will request a HST sleep study and look for snoring or sleep apnea. You will be called with the HST results.   I also wrote a prescription for a night time nasal spray to help with airflow and sinus pressure.

## 2018-07-14 ENCOUNTER — Other Ambulatory Visit: Payer: Self-pay

## 2018-07-14 ENCOUNTER — Ambulatory Visit (INDEPENDENT_AMBULATORY_CARE_PROVIDER_SITE_OTHER): Payer: Medicaid Other | Admitting: Family Medicine

## 2018-07-14 ENCOUNTER — Encounter: Payer: Self-pay | Admitting: Family Medicine

## 2018-07-14 DIAGNOSIS — J020 Streptococcal pharyngitis: Secondary | ICD-10-CM

## 2018-07-14 MED ORDER — AMOXICILLIN 500 MG PO CAPS: 500.0000 mg | ORAL_CAPSULE | Freq: Two times a day (BID) | ORAL | 0 refills | Status: DC

## 2018-07-14 NOTE — Progress Notes (Signed)
Virtual Visit via telephone Note  I connected with Kurt Bowen on 07/14/18 at 1508 by telephone and verified that I am speaking with the correct person using two identifiers. Kurt Bowen is currently located at home and no other people are currently with her during visit. The provider, Elige RadonJoshua A Raiden Haydu, MD is located in their office at time of visit.  Call ended at 1517  I discussed the limitations, risks, security and privacy concerns of performing an evaluation and management service by telephone and the availability of in person appointments. I also discussed with the patient that there may be a patient responsible charge related to this service. The patient expressed understanding and agreed to proceed.   History and Present Illness: Sore throat and painful and difficulty swallowing.  Has had strep previously and it was strep then. Patient denies any fevers or chills or sob or wheezing.  Denies any sick contacts or covid 19 exposure.  Patient says is been going on for 2 days and it is worsened over the past couple days.  He has not used anything over-the-counter for it but the last time he had this it felt very much like strep and he tested positive for strep a year and a half ago and that is what he feels like he has again.  He denies any cough or nasal congestion or drainage but he says it is just his throat and feels very raw and it looks very red when he looks at it in the mirror.  No diagnosis found.  Outpatient Encounter Medications as of 07/14/2018  Medication Sig  . mometasone (NASONEX) 50 MCG/ACT nasal spray Place 2 sprays into the nose daily.   No facility-administered encounter medications on file as of 07/14/2018.     Review of Systems  Constitutional: Negative for chills and fever.  HENT: Positive for sore throat. Negative for congestion, ear discharge, ear pain, postnasal drip, rhinorrhea, sinus pressure, sneezing and voice change.   Eyes: Negative for pain,  discharge, redness and visual disturbance.  Respiratory: Negative for cough, shortness of breath and wheezing.   Cardiovascular: Negative for chest pain and leg swelling.  Musculoskeletal: Negative for gait problem.  Skin: Negative for rash.  All other systems reviewed and are negative.   Observations/Objective: Patient sounds in no acute distress  Assessment and Plan: Problem List Items Addressed This Visit    None    Visit Diagnoses    Pharyngitis due to Streptococcus species    -  Primary   Relevant Medications   amoxicillin (AMOXIL) 500 MG capsule       Follow Up Instructions:  As needed  Recommended isolation for 2 weeks or at least 3 days symptom-free in case it could be coronavirus.   I discussed the assessment and treatment plan with the patient. The patient was provided an opportunity to ask questions and all were answered. The patient agreed with the plan and demonstrated an understanding of the instructions.   The patient was advised to call back or seek an in-person evaluation if the symptoms worsen or if the condition fails to improve as anticipated.  The above assessment and management plan was discussed with the patient. The patient verbalized understanding of and has agreed to the management plan. Patient is aware to call the clinic if symptoms persist or worsen. Patient is aware when to return to the clinic for a follow-up visit. Patient educated on when it is appropriate to go to the emergency department.  I provided 9 minutes of non-face-to-face time during this encounter.    Nils Pyle, MD

## 2018-08-06 ENCOUNTER — Telehealth: Payer: Self-pay | Admitting: Neurology

## 2018-08-06 DIAGNOSIS — R0689 Other abnormalities of breathing: Secondary | ICD-10-CM

## 2018-08-06 DIAGNOSIS — G4719 Other hypersomnia: Secondary | ICD-10-CM

## 2018-08-06 DIAGNOSIS — F5101 Primary insomnia: Secondary | ICD-10-CM

## 2018-08-06 NOTE — Addendum Note (Signed)
Addended by: Judi Cong on: 08/06/2018 08:47 AM   Modules accepted: Orders

## 2018-08-06 NOTE — Telephone Encounter (Signed)
Medicaid will not cover a home sleep study. Please place an order for the pt to have an in lab study.  °

## 2018-08-06 NOTE — Telephone Encounter (Signed)
Order placed

## 2018-08-12 ENCOUNTER — Ambulatory Visit (INDEPENDENT_AMBULATORY_CARE_PROVIDER_SITE_OTHER): Payer: Medicaid Other | Admitting: Neurology

## 2018-08-12 ENCOUNTER — Other Ambulatory Visit: Payer: Self-pay

## 2018-08-12 DIAGNOSIS — F5101 Primary insomnia: Secondary | ICD-10-CM

## 2018-08-12 DIAGNOSIS — R0689 Other abnormalities of breathing: Secondary | ICD-10-CM

## 2018-08-12 DIAGNOSIS — G4710 Hypersomnia, unspecified: Secondary | ICD-10-CM | POA: Diagnosis not present

## 2018-08-12 DIAGNOSIS — G4719 Other hypersomnia: Secondary | ICD-10-CM

## 2018-08-18 NOTE — Procedures (Signed)
SPLIT night and PAP titration studies were suspended during the Coronavirus pandemic , following AASM guidelines.   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 Louanne Skyeettinger for a sleep evaluation.    Chief complaint according to patient: The patient reports that he has been suffering from poor sleep quality, fragmented sleep, sleep deprivation and excessive daytime sleepiness.  His wife has complained about his snoring being so loud that she can hear it in another room. The patient estimates that he gets only about 4 hours total sleep time at night and that he still has 2 if not 3 bathroom breaks each night. He stated that his weight has been stable for many years, he is a non-smoker and nondrinker and does not tolerate caffeine.   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  He reported that he was snoring loudly sometimes gasping for breath as his wife has noted, sometimes waking with chest tightness.  His wife has slept in another room on several occasions.  The patient usually has a 1 hour commute and work ends at 8 PM dinner will therefore be late between 9 and 10 PM and bedtime is between 10 and 11 PM or thereabouts.  He describes his bedroom as cool, quiet, and dark and he prefers to sleep on his sides usually with 2 pillows for head support.  He will fall asleep but only stay asleep for 2 sometimes 3 hours and then has to go to the bathroom. Somewhere between 6 and 7 AM he is usually able to get back to sleep for another 2 hours but sometimes wakes up with a dry mouth, often with nasal congestion and sometimes with headaches that are resolving without any medication to be taken.  He estimates  the total sleep time to be between 4 and 5 hours at the most.  Sometimes, he has an opportunity to take a brief power nap and he feels that 10 to 30-minute naps are refreshing.    The patient endorsed the Epworth Sleepiness Scale at 15 points.   The patient's weight 185 pounds with a height of 69 (inches), resulting in a BMI of 27.4 kg/m2. The patient's neck circumference measured 18 inches.  CURRENT MEDICATIONS: None listed.   PROCEDURE:  This is a multichannel digital polysomnogram utilizing the Somnostar 11.2 system.  Electrodes and sensors were applied and monitored per AASM Specifications.   EEG, EOG, Chin and Limb EMG, were sampled at 200 Hz.  ECG, Snore and Nasal Pressure, Thermal Airflow, Respiratory Effort, CPAP Flow and Pressure, Oximetry was sampled at 50 Hz. Digital video and audio were recorded.      BASELINE STUDY Lights Out was at 22:15 and Lights On at 04:52.  Total recording time (TRT) was 398 minutes, with a total sleep time (TST) of 287 minutes.   The patient's sleep latency was 23 minutes.  REM latency was 69 minutes.  The sleep efficiency was 72.1 %.     SLEEP ARCHITECTURE: WASO (Wake after sleep onset) was 88.5 minutes.  There were 27.5 minutes in Stage N1, 102 minutes Stage N2, 105 minutes Stage N3 and 52.5 minutes in Stage  REM.  The percentage of Stage N1 was 9.6%, Stage N2 was 35.5%, Stage N3 was 36.6% and Stage R (REM sleep) was 18.3%.    RESPIRATORY ANALYSIS:  There were a total of 9 respiratory events:  0 obstructive apneas, 0 central apneas and 0 mixed apneas with a total of 0 apneas and an apnea index (AI) of 0 /hour. There were 9 hypopneas with a hypopnea index of 1.9 /hour. The patient also had 0 respiratory event related arousals (RERAs).   The total APNEA/HYPOPNEA INDEX (AHI) was 1.9 /hour.  5 events occurred in REM sleep and 8 events in NREM. The REM AHI was 5.7 /hour, versus a non-REM AHI of 1. The patient spent 13.5 minutes of total sleep time in the supine  position and 274 minutes in non-supine. The supine AHI was 0.0 versus a non-supine AHI of 2.0.  OXYGEN SATURATION & C02:  The Wake baseline 02 saturation was 95%, with the lowest being 86%. Time spent below 89% saturation equaled 4 minutes.  The arousals were noted as: 40 were spontaneous, 0 were associated with PLMs, and 2 were associated with respiratory events. The patient had a total of 0 Periodic Limb Movements.   Audio and video analysis did not show any abnormal or unusual movements, behaviors, phonations or vocalizations.  Snoring was noted. EKG documented highly irregular heart rhythm (!)- I attached a screen shot. These occurred in REM and in NREM sleep.   IMPRESSION: 1. No significant OSA or CSA found.  2. No Periodic Limb Movement Disorder (PLMD) 3. Loud Primary Snoring without sleep hypoxemia.  4. Non-specific abnormal EKG- see screenshots.   RECOMMENDATIONS: 1. Advise urgent visit with PCP to investigate heart rate and rhythm. 2. Referral for snoring treatment to either dentist or ENT specialist. 3. To follow up in the sleep clinic is optional.  I certify that I have reviewed the entire raw data recording prior to the issuance of this report in accordance with the Standards of Accreditation of the American Academy of Sleep Medicine (AASM)  Melvyn Novas, MD   08-18-2018 Diplomat, American Board of Psychiatry and Neurology  Diplomat, American Board of Sleep Medicine Medical Director, [place] [facility]

## 2018-08-20 ENCOUNTER — Telehealth: Payer: Self-pay | Admitting: Neurology

## 2018-08-20 DIAGNOSIS — R0683 Snoring: Secondary | ICD-10-CM

## 2018-08-20 DIAGNOSIS — G4719 Other hypersomnia: Secondary | ICD-10-CM

## 2018-08-20 DIAGNOSIS — R0689 Other abnormalities of breathing: Secondary | ICD-10-CM

## 2018-08-20 DIAGNOSIS — G4700 Insomnia, unspecified: Secondary | ICD-10-CM

## 2018-08-20 DIAGNOSIS — Z72820 Sleep deprivation: Secondary | ICD-10-CM

## 2018-08-20 NOTE — Telephone Encounter (Signed)
Robin our sleep lab manager reviewed the report for the patient since Dr Vickey Huger isn't here. She states that he went into sleep stages. Zella Ball suggested the patient may benefit completing a sleep study and MSLT since his EPW is so high. She states insurance can cover that and it will allow another night sleep here and test for narcolepsy. I will touch base with Dr Vickey Huger and see if this is something worth offering the patient.

## 2018-08-20 NOTE — Telephone Encounter (Signed)
-----  Message from Larey Seat, MD sent at 08/18/2018  5:54 PM EDT ----- IMPRESSION: 1. No significant OSA or CSA found.  2. No Periodic Limb Movement Disorder (PLMD) was noted. 3. Loud Primary Snoring was documented with deep inspiration and without sleep hypoxemia.  4. Non-specific abnormal EKG- see screenshots.   RECOMMENDATIONS: 1. Advise urgent visit with PCP to investigate heart rate and  Rhythm. Please share the related screen shots with Dr Warrick Parisian.  2. Referral for snoring treatment to either dentist or ENT  specialist. 3. To follow up in the sleep clinic with me or NP is optional, hypersomnia related HLA testing may be entertained.

## 2018-08-20 NOTE — Telephone Encounter (Signed)
Called the patient and advised him of the findings. The patient states that he didn't feel like he slept at all. Informed that based off the study findings I reviewed the total sleep time listed on here was 287 min and sleep efficeiency was rated about 72 %. Advised the findings that no apnea was found but advised of the irregular heart rate/rhythm and advised that Dr Vickey Huger recommended informing the PCP of this so they can follow up on this. Patient states that he just knows something is off and he is waking up gasping for air on other nights and it is causing him to feel exhausted during the day time. Advised I would have our sleep technician review the data again. Patient was appreciative.

## 2018-08-25 NOTE — Telephone Encounter (Signed)
Called the patient and informed him that Dr Vickey Huger would recommend the patient complete a overnight study and MSLT. This will allow an opportunity to complete a overnight test.

## 2018-08-25 NOTE — Addendum Note (Signed)
Addended by: Judi Cong on: 08/25/2018 05:03 PM   Modules accepted: Orders

## 2018-09-01 ENCOUNTER — Telehealth: Payer: Self-pay | Admitting: Family Medicine

## 2018-09-01 DIAGNOSIS — R9431 Abnormal electrocardiogram [ECG] [EKG]: Secondary | ICD-10-CM

## 2018-09-01 NOTE — Telephone Encounter (Signed)
PT wife is calling on behalf of the pt The dr that did sleep test saw something twice pop up on EKG and that person told him he may want to see cardiologist may have heart disease. (we referred pt to get the sleep study one, somewhere in [place])

## 2018-09-03 NOTE — Telephone Encounter (Signed)
Yes go ahead and place referral for cardiology

## 2018-09-03 NOTE — Telephone Encounter (Signed)
Referral to be ordered.

## 2018-09-03 NOTE — Telephone Encounter (Signed)
Referral placed- attempted to contact patient NA

## 2018-09-03 NOTE — Telephone Encounter (Signed)
Spouse is calling about recent sleep study recommendations. She was told to call pcp to have another referral for cardiologist. Please call back

## 2018-09-19 ENCOUNTER — Telehealth: Payer: Self-pay | Admitting: *Deleted

## 2018-09-19 NOTE — Telephone Encounter (Signed)
    COVID-19 Pre-Screening Questions:  . In the past 7 to 10 days have you had a cough,  shortness of breath, headache, congestion, fever (100 or greater) body aches, chills, sore throat, or sudden loss of taste or sense of smell? . Have you been around anyone with known Covid 19. . Have you been around anyone who is awaiting Covid 19 test results in the past 7 to 10 days? . Have you been around anyone who has been exposed to Covid 19, or has mentioned symptoms of Covid 19 within the past 7 to 10 days?  If you have any concerns/questions about symptoms patients report during screening (either on the phone or at threshold). Contact the provider seeing the patient or DOD for further guidance.  If neither are available contact a member of the leadership team.       Patient answered no to all questions. He knows to wear a mask, arrive only 15 min early, to come alone and to call if his health changes. CP/cma

## 2018-09-23 NOTE — Progress Notes (Signed)
Cardiology Office Note   Date:  09/24/2018   ID:  Kurt Bowen, DOB 04/09/1970, MRN [id number]  PCP:  Bowen, Elige RadonJoshua A, MD  Cardiologist:   No primary care provider on file. Referring:  Bowen, Elige RadonJoshua A, MD  No chief complaint on file.     History of Present Illness: Kurt Bowen is a 48 y.o. male who presents for evaluation abnormal rhythms noted during sleep study.  He has no past cardiac history.  He has severe insomnia or other sleep disorder that he is having evaluated.  He was not noted to have sleep apnea on his recent study but it was apparently incomplete study and that he did not feel like he slept very well.  He is going to have some redo investigation.  However, there was mention of him having some bradycardia and perhaps heart block on this..  Unfortunately, after searching the chart and calling the sleep MD office we are unable to find any strips.    Patient denies any cardiovascular symptoms.  His biggest issue is somnolence.  He does not sleep well at all any then wants to fall asleep recently during the day.  He is fatigued when he tries to go to bed in the morning.  He does not have any presyncope or syncope.  He occasionally has some palpitations only after he eats poorly with for instance fatty food.  If he eats well he does not have any palpitations.  He has no chest pressure, neck or arm discomfort.  He said no weight gain or edema.  After some questioning, he did report that he had some dizzy spells sometimes at work.  He has had about 3 - 6 episodes over the past 6 months.  He is not describing orthostasis.      Past Medical History:  Diagnosis Date  . GERD (gastroesophageal reflux disease)   . Hyperlipidemia   . Ulcer     Past Surgical History:  Procedure Laterality Date  . FOOT SURGERY     left     Current Outpatient Medications  Medication Sig Dispense Refill  . mometasone (NASONEX) 50 MCG/ACT nasal spray Place 2 sprays into the nose  daily. (Patient not taking: Reported on 08/25/2018) 17 g 2   No current facility-administered medications for this visit.     Allergies:   Patient has no known allergies.    Social History:  The patient  reports that he quit smoking about 12 years ago. His smoking use included cigarettes. He has never used smokeless tobacco. He reports that he does not drink alcohol or use drugs.   Family History:  The patient's family history includes Hyperlipidemia in his father.    ROS:  Please see the history of present illness.   Otherwise, review of systems are positive for none.   All other systems are reviewed and negative.    PHYSICAL EXAM: VS:  BP 120/90   Pulse 72   Ht 5\' 9"  (1.753 m)   Wt 181 lb (82.1 kg)   BMI 26.73 kg/m  , BMI Body mass index is 26.73 kg/m. GENERAL:  Well appearing HEENT:  Pupils equal round and reactive, fundi not visualized, oral mucosa unremarkable NECK:  No jugular venous distention, waveform within normal limits, carotid upstroke brisk and symmetric, no bruits, no thyromegaly LYMPHATICS:  No cervical, inguinal adenopathy LUNGS:  Clear to auscultation bilaterally BACK:  No CVA tenderness CHEST:  Unremarkable HEART:  PMI not displaced or sustained,S1 and S2 within  normal limits, no S3, no S4, no clicks, no rubs, no murmurs ABD:  Flat, positive bowel sounds normal in frequency in pitch, no bruits, no rebound, no guarding, no midline pulsatile mass, no hepatomegaly, no splenomegaly EXT:  2 plus pulses throughout, no edema, no cyanosis no clubbing SKIN:  No rashes no nodules NEURO:  Cranial nerves II through XII grossly intact, motor grossly intact throughout PSYCH:  Cognitively intact, oriented to person place and time    EKG:  EKG is ordered today. The ekg ordered today demonstrates sinus rhythm, rate 72, axis within normal limits, intervals within normal limits, lateral T wave inversions without old EKGs for comparison.   Recent Labs: 01/03/2018: ALT 48;  BUN 17; Creatinine, Ser 0.89; Hemoglobin 15.7; Platelets 282; Potassium 4.4; Sodium 140    Lipid Panel    Component Value Date/Time   CHOL 196 01/03/2018 1153   CHOL 167 06/20/2012 0957   TRIG 228 (H) 01/03/2018 1153   TRIG 104 01/06/2013 0841   TRIG 78 06/20/2012 0957   HDL 27 (L) 01/03/2018 1153   HDL 33 (L) 01/06/2013 0841   HDL 35 (L) 06/20/2012 0957   CHOLHDL 7.3 (H) 01/03/2018 1153   LDLCALC 123 (H) 01/03/2018 1153   LDLCALC 115 (H) 01/06/2013 0841   LDLCALC 116 (H) 06/20/2012 0957      Wt Readings from Last 3 Encounters:  09/24/18 181 lb (82.1 kg)  01/03/18 185 lb 12.8 oz (84.3 kg)  07/11/15 169 lb 12.8 oz (77 kg)      Other studies Reviewed: Additional studies/ records that were reviewed today include: sleep study. Review of the above records demonstrates:  Please see elsewhere in the note.     ASSESSMENT AND PLAN:  ARRHYTHMIA:   I am going to start with a review of the sleep study rhythm strips.  I have a call into Dr. Edwena Felty office.  He does have some vague symptoms.  Pending these results I will consider an event monitor.  However, at this point I am not suspecting a significant arrhythmia.      Current medicines are reviewed at length with the patient today.  The patient does not have concerns regarding medicines.  The following changes have been made:  no change  Labs/ tests ordered today include: None No orders of the defined types were placed in this encounter.   Disposition:   FU with me based on the results of the rhythm strip review.      Signed, Minus Breeding, MD  09/24/2018 3:55 PM    [facility]

## 2018-09-24 ENCOUNTER — Encounter: Payer: Self-pay | Admitting: Cardiology

## 2018-09-24 ENCOUNTER — Other Ambulatory Visit: Payer: Self-pay

## 2018-09-24 ENCOUNTER — Ambulatory Visit (INDEPENDENT_AMBULATORY_CARE_PROVIDER_SITE_OTHER): Payer: Medicaid Other | Admitting: Cardiology

## 2018-09-24 ENCOUNTER — Ambulatory Visit: Payer: BC Managed Care – PPO | Admitting: Cardiology

## 2018-09-24 VITALS — BP 120/90 | HR 72 | Ht 69.0 in | Wt 181.0 lb

## 2018-09-24 DIAGNOSIS — R002 Palpitations: Secondary | ICD-10-CM | POA: Diagnosis not present

## 2018-09-24 NOTE — Patient Instructions (Signed)
Medication Instructions:  The current medical regimen is effective;  continue present plan and medications.  If you need a refill on your cardiac medications before your next appointment, please call your pharmacy.   Follow-Up: Follow up to be determined after receiving EKG results from sleep study completed by Dr Brett Fairy.  Thank you for choosing [facility]!!

## 2018-10-08 ENCOUNTER — Encounter

## 2018-10-08 ENCOUNTER — Ambulatory Visit: Payer: BC Managed Care – PPO | Admitting: Cardiology

## 2018-10-11 DIAGNOSIS — T1490XA Injury, unspecified, initial encounter: Secondary | ICD-10-CM | POA: Diagnosis not present

## 2018-10-11 DIAGNOSIS — R5381 Other malaise: Secondary | ICD-10-CM | POA: Diagnosis not present

## 2018-10-11 DIAGNOSIS — Z209 Contact with and (suspected) exposure to unspecified communicable disease: Secondary | ICD-10-CM | POA: Diagnosis not present

## 2018-11-13 ENCOUNTER — Ambulatory Visit: Payer: Medicaid Other | Admitting: Family Medicine

## 2019-12-01 ENCOUNTER — Other Ambulatory Visit: Payer: Self-pay

## 2019-12-04 ENCOUNTER — Other Ambulatory Visit: Payer: Self-pay

## 2019-12-08 ENCOUNTER — Other Ambulatory Visit: Payer: Self-pay

## 2019-12-15 ENCOUNTER — Other Ambulatory Visit: Payer: Medicaid Other

## 2019-12-22 ENCOUNTER — Other Ambulatory Visit: Payer: Medicaid Other

## 2020-02-04 DIAGNOSIS — K047 Periapical abscess without sinus: Secondary | ICD-10-CM | POA: Diagnosis not present

## 2020-04-05 DIAGNOSIS — Z20828 Contact with and (suspected) exposure to other viral communicable diseases: Secondary | ICD-10-CM | POA: Diagnosis not present

## 2020-04-05 DIAGNOSIS — U071 COVID-19: Secondary | ICD-10-CM | POA: Diagnosis not present

## 2020-04-26 DIAGNOSIS — U071 COVID-19: Secondary | ICD-10-CM | POA: Diagnosis not present

## 2020-04-26 DIAGNOSIS — Z20828 Contact with and (suspected) exposure to other viral communicable diseases: Secondary | ICD-10-CM | POA: Diagnosis not present

## 2020-05-03 DIAGNOSIS — U071 COVID-19: Secondary | ICD-10-CM | POA: Diagnosis not present

## 2020-05-03 DIAGNOSIS — Z20828 Contact with and (suspected) exposure to other viral communicable diseases: Secondary | ICD-10-CM | POA: Diagnosis not present

## 2020-05-10 DIAGNOSIS — U071 COVID-19: Secondary | ICD-10-CM | POA: Diagnosis not present

## 2020-05-10 DIAGNOSIS — Z20828 Contact with and (suspected) exposure to other viral communicable diseases: Secondary | ICD-10-CM | POA: Diagnosis not present

## 2020-05-17 DIAGNOSIS — Z20828 Contact with and (suspected) exposure to other viral communicable diseases: Secondary | ICD-10-CM | POA: Diagnosis not present

## 2020-05-17 DIAGNOSIS — U071 COVID-19: Secondary | ICD-10-CM | POA: Diagnosis not present

## 2020-05-24 DIAGNOSIS — Z20828 Contact with and (suspected) exposure to other viral communicable diseases: Secondary | ICD-10-CM | POA: Diagnosis not present

## 2020-05-24 DIAGNOSIS — U071 COVID-19: Secondary | ICD-10-CM | POA: Diagnosis not present

## 2020-06-07 DIAGNOSIS — U071 COVID-19: Secondary | ICD-10-CM | POA: Diagnosis not present

## 2020-06-07 DIAGNOSIS — Z20828 Contact with and (suspected) exposure to other viral communicable diseases: Secondary | ICD-10-CM | POA: Diagnosis not present

## 2020-06-14 DIAGNOSIS — Z20828 Contact with and (suspected) exposure to other viral communicable diseases: Secondary | ICD-10-CM | POA: Diagnosis not present

## 2020-06-14 DIAGNOSIS — U071 COVID-19: Secondary | ICD-10-CM | POA: Diagnosis not present

## 2020-07-25 DIAGNOSIS — I10 Essential (primary) hypertension: Secondary | ICD-10-CM | POA: Diagnosis not present

## 2020-07-25 DIAGNOSIS — R0789 Other chest pain: Secondary | ICD-10-CM | POA: Diagnosis not present

## 2020-07-25 DIAGNOSIS — R079 Chest pain, unspecified: Secondary | ICD-10-CM | POA: Diagnosis not present

## 2020-07-26 ENCOUNTER — Encounter: Payer: Self-pay | Admitting: Nurse Practitioner

## 2020-07-26 ENCOUNTER — Ambulatory Visit: Payer: BC Managed Care – PPO | Admitting: Nurse Practitioner

## 2020-07-26 ENCOUNTER — Other Ambulatory Visit: Payer: Self-pay

## 2020-07-26 VITALS — BP 127/67 | HR 67 | Temp 98.1°F | Ht 69.0 in | Wt 179.0 lb

## 2020-07-26 DIAGNOSIS — R55 Syncope and collapse: Secondary | ICD-10-CM | POA: Diagnosis not present

## 2020-07-26 LAB — COMPREHENSIVE METABOLIC PANEL
BUN/Creatinine Ratio: 11 (ref 9–20)
CO2: 20 mmol/L (ref 20–29)

## 2020-07-26 LAB — CBC WITH DIFFERENTIAL/PLATELET
EOS (ABSOLUTE): 0.1 10*3/uL (ref 0.0–0.4)
Monocytes Absolute: 0.4 10*3/uL (ref 0.1–0.9)

## 2020-07-26 LAB — LIPID PANEL: Cholesterol, Total: 233 mg/dL — ABNORMAL HIGH (ref 100–199)

## 2020-07-26 NOTE — Progress Notes (Signed)
Acute Office Visit  Subjective:    Patient ID: Kurt Bowen, male    DOB: 03-Jul-1970, 50 y.o.   MRN: [id number]  Chief Complaint  Patient presents with  . Fatigue    Loss of Consciousness This is a new problem. The current episode started yesterday. The problem occurs rarely. The problem has been resolved. There was no loss of consciousness. Exacerbated by: eating heavy meals after religious fast. Associated symptoms include nausea and palpitations. Pertinent negatives include no abdominal pain, bladder incontinence, bowel incontinence, chest pain, slurred speech or vomiting. He has tried nothing for the symptoms.     Past Medical History:  Diagnosis Date  . GERD (gastroesophageal reflux disease)   . Hyperlipidemia   . Ulcer     Past Surgical History:  Procedure Laterality Date  . FOOT SURGERY     left    Family History  Problem Relation Age of Onset  . Hyperlipidemia Father     Social History   Socioeconomic History  . Marital status: Married    Spouse name: Not on file  . Number of children: Not on file  . Years of education: Not on file  . Highest education level: Not on file  Occupational History  . Not on file  Tobacco Use  . Smoking status: Former Smoker    Types: Cigarettes    Quit date: 2008    Years since quitting: 14.3  . Smokeless tobacco: Never Used  Vaping Use  . Vaping Use: Never used  Substance and Sexual Activity  . Alcohol use: No  . Drug use: No  . Sexual activity: Not on file  Other Topics Concern  . Not on file  Social History Narrative   Lives at home home with wife and two children.     Social Determinants of Health   Financial Resource Strain: Not on file  Food Insecurity: Not on file  Transportation Needs: Not on file  Physical Activity: Not on file  Stress: Not on file  Social Connections: Not on file  Intimate Partner Violence: Not on file    Outpatient Medications Prior to Visit  Medication Sig Dispense Refill   . mometasone (NASONEX) 50 MCG/ACT nasal spray Place 2 sprays into the nose daily. (Patient not taking: Reported on 08/25/2018) 17 g 2   No facility-administered medications prior to visit.    No Known Allergies  Review of Systems  Constitutional: Negative.   HENT: Negative.   Respiratory: Negative.   Cardiovascular: Positive for palpitations and syncope. Negative for chest pain and leg swelling.  Gastrointestinal: Positive for nausea. Negative for abdominal pain, bowel incontinence and vomiting.  Genitourinary: Negative.  Negative for bladder incontinence.  Musculoskeletal: Negative.   Skin: Negative.   All other systems reviewed and are negative.      Objective:    Physical Exam Vitals reviewed.  Constitutional:      Appearance: Normal appearance.  HENT:     Head: Normocephalic.     Nose: Nose normal.  Eyes:     Conjunctiva/sclera: Conjunctivae normal.  Cardiovascular:     Rate and Rhythm: Normal rate and regular rhythm.     Pulses: Normal pulses.     Heart sounds: Normal heart sounds.  Pulmonary:     Effort: Pulmonary effort is normal.     Breath sounds: Normal breath sounds.  Abdominal:     General: Bowel sounds are normal.  Musculoskeletal:     Right lower leg: No edema.     Left  lower leg: No edema.  Neurological:     Mental Status: He is alert and oriented to person, place, and time.  Psychiatric:        Behavior: Behavior normal.     BP 127/67   Pulse 67   Temp 98.1 F (36.7 C) (Temporal)   Ht 5\' 9"  (1.753 m)   Wt 179 lb (81.2 kg)   SpO2 99%   BMI 26.43 kg/m  Wt Readings from Last 3 Encounters:  07/26/20 179 lb (81.2 kg)  09/24/18 181 lb (82.1 kg)  01/03/18 185 lb 12.8 oz (84.3 kg)    Health Maintenance Due  Topic Date Due  . Hepatitis C Screening  Never done  . COVID-19 Vaccine (1) Never done  . HIV Screening  Never done  . TETANUS/TDAP  Never done  . COLONOSCOPY (Pts 45-60yrs Insurance coverage will need to be confirmed)  Never done     There are no preventive care reminders to display for this patient.   No results found for: TSH Lab Results  Component Value Date   WBC 5.8 01/03/2018   HGB 15.7 01/03/2018   HCT 47.1 01/03/2018   MCV 88 01/03/2018   PLT 282 01/03/2018   Lab Results  Component Value Date   NA 140 01/03/2018   K 4.4 01/03/2018   CO2 21 01/03/2018   GLUCOSE 80 01/03/2018   BUN 17 01/03/2018   CREATININE 0.89 01/03/2018   BILITOT 0.2 01/03/2018   ALKPHOS 88 01/03/2018   AST 24 01/03/2018   ALT 48 (H) 01/03/2018   PROT 7.0 01/03/2018   ALBUMIN 4.8 01/03/2018   CALCIUM 9.6 01/03/2018   Lab Results  Component Value Date   CHOL 196 01/03/2018   Lab Results  Component Value Date   HDL 27 (L) 01/03/2018   Lab Results  Component Value Date   LDLCALC 123 (H) 01/03/2018   Lab Results  Component Value Date   TRIG 228 (H) 01/03/2018   Lab Results  Component Value Date   CHOLHDL 7.3 (H) 01/03/2018   No results found for: HGBA1C     Assessment & Plan:  Near syncope Patient is 50 year old male following up for near syncopal episode in the last 24 hours.  Patient reports after breaking his religious fast he had 4 hotdogs, and hamburger and ate too fast.  After completing his meal he felt dizzy, nauseous and had a near syncopal episode.  911 was called to the scene.  EKG completed with normal sinus rhythm.  Patient is following up today after assessment all vital signs are within patient's normal limits.  Patient is not reporting any pain, shortness of breath, dizziness, visual disturbances or feelings of syncope.  Completed troponin levels assisted by patient that EMS told him to get troponin levels checked.  Patient reports having a history of elevated cholesterol and is not currently on medication because he did not want to be on any cholesterol medication and would rather reduce cholesterol naturally.  Patient has not had a physical or blood drawn in the last 2 years.  Scheduled patient  for physical with PCP, lab work already completed.   Education provided to patient with printed handouts given on the dangers of untreated high cholesterol and stroke.  Follow-up with worsening unresolved symptoms.  Problem List Items Addressed This Visit   None   Visit Diagnoses    Near syncope    -  Primary   Relevant Orders   CBC with Differential   Comprehensive  metabolic panel   Lipid Panel   Troponin T       No orders of the defined types were placed in this encounter.    Daryll Drown, NP

## 2020-07-26 NOTE — Assessment & Plan Note (Signed)
Patient is 50 year old male following up for near syncopal episode in the last 24 hours.  Patient reports after breaking his religious fast he had 4 hotdogs, and hamburger and ate too fast.  After completing his meal he felt dizzy, nauseous and had a near syncopal episode.  911 was called to the scene.  EKG completed with normal sinus rhythm.  Patient is following up today after assessment all vital signs are within patient's normal limits.  Patient is not reporting any pain, shortness of breath, dizziness, visual disturbances or feelings of syncope.  Completed troponin levels assisted by patient that EMS told him to get troponin levels checked.  Patient reports having a history of elevated cholesterol and is not currently on medication because he did not want to be on any cholesterol medication and would rather reduce cholesterol naturally.  Patient has not had a physical or blood drawn in the last 2 years.  Scheduled patient for physical with PCP, lab work already completed.   Education provided to patient with printed handouts given on the dangers of untreated high cholesterol and stroke.  Follow-up with worsening unresolved symptoms.

## 2020-07-26 NOTE — Patient Instructions (Signed)
Syncope Syncope is when you pass out (faint) for a short time. It is caused by a sudden decrease in blood flow to the brain. Signs that you may be about to pass out include:  Feeling dizzy or light-headed.  Feeling sick to your stomach (nauseous).  Seeing all white or all black.  Having cold, clammy skin. If you pass out, get help right away. Call your local emergency services (911 in the [place] Do not drive yourself to the hospital. Follow these instructions at home: Watch for any changes in your symptoms. Take these actions to stay safe and help with your symptoms: Lifestyle  Do not drive, use machinery, or play sports until your doctor says it is okay.  Do not drink alcohol.  Do not use any products that contain nicotine or tobacco, such as cigarettes and e-cigarettes. If you need help quitting, ask your doctor.  Drink enough fluid to keep your pee (urine) pale yellow. General instructions  Take over-the-counter and prescription medicines only as told by your doctor.  If you are taking blood pressure or heart medicine, sit up and stand up slowly. Spend a few minutes getting ready to sit and then stand. This can help you feel less dizzy.  Have someone stay with you until you feel stable.  If you start to feel like you might pass out, lie down right away and raise (elevate) your feet above the level of your heart. Breathe deeply and steadily. Wait until all of the symptoms are gone.  Keep all follow-up visits as told by your doctor. This is important. Get help right away if:  You have a very bad headache.  You pass out once or more than once.  You have pain in your chest, belly, or back.  You have a very fast or uneven heartbeat (palpitations).  It hurts to breathe.  You are bleeding from your mouth or your bottom (rectum).  You have black or tarry poop (stool).  You have jerky movements that you cannot control (seizure).  You are confused.  You have trouble  walking.  You are very weak.  You have vision problems. These symptoms may be an emergency. Do not wait to see if the symptoms will go away. Get medical help right away. Call your local emergency services (911 in the [place] Do not drive yourself to the hospital. Summary  Syncope is when you pass out (faint) for a short time. It is caused by a sudden decrease in blood flow to the brain.  Signs that you may be about to faint include feeling dizzy, light-headed, or sick to your stomach, seeing all white or all black, or having cold, clammy skin.  If you start to feel like you might pass out, lie down right away and raise (elevate) your feet above the level of your heart. Breathe deeply and steadily. Wait until all of the symptoms are gone. This information is not intended to replace advice given to you by your health care provider. Make sure you discuss any questions you have with your health care provider. Document Revised: 04/22/2019 Document Reviewed: 04/24/2017 Elsevier Patient Education  2021 Elsevier Inc.  

## 2020-07-27 LAB — CBC WITH DIFFERENTIAL/PLATELET
Basophils Absolute: 0 10*3/uL (ref 0.0–0.2)
Basos: 1 %
Eos: 2 %
Hematocrit: 46.2 % (ref 37.5–51.0)
Hemoglobin: 15.7 g/dL (ref 13.0–17.7)
Immature Grans (Abs): 0 10*3/uL (ref 0.0–0.1)
Immature Granulocytes: 0 %
Lymphocytes Absolute: 1.7 10*3/uL (ref 0.7–3.1)
Lymphs: 37 %
MCH: 29.7 pg (ref 26.6–33.0)
MCHC: 34 g/dL (ref 31.5–35.7)
MCV: 87 fL (ref 79–97)
Monocytes: 9 %
Neutrophils Absolute: 2.4 10*3/uL (ref 1.4–7.0)
Neutrophils: 51 %
Platelets: 266 10*3/uL (ref 150–450)
RBC: 5.29 x10E6/uL (ref 4.14–5.80)
RDW: 13 % (ref 11.6–15.4)
WBC: 4.6 10*3/uL (ref 3.4–10.8)

## 2020-07-27 LAB — TROPONIN T: Troponin T (Highly Sensitive): <6 ng/L (ref 0–22)

## 2020-07-27 LAB — COMPREHENSIVE METABOLIC PANEL
ALT: 31 IU/L (ref 0–44)
AST: 19 IU/L (ref 0–40)
Albumin/Globulin Ratio: 2.1 (ref 1.2–2.2)
Albumin: 4.8 g/dL (ref 4.0–5.0)
Alkaline Phosphatase: 90 IU/L (ref 44–121)
BUN: 11 mg/dL (ref 6–24)
Bilirubin Total: 0.5 mg/dL (ref 0.0–1.2)
Calcium: 9.7 mg/dL (ref 8.7–10.2)
Chloride: 106 mmol/L (ref 96–106)
Creatinine, Ser: 0.98 mg/dL (ref 0.76–1.27)
Globulin, Total: 2.3 g/dL (ref 1.5–4.5)
Glucose: 95 mg/dL (ref 65–99)
Potassium: 4.6 mmol/L (ref 3.5–5.2)
Sodium: 140 mmol/L (ref 134–144)
Total Protein: 7.1 g/dL (ref 6.0–8.5)
eGFR: 95 mL/min/{1.73_m2} (ref >59)

## 2020-07-27 LAB — LIPID PANEL
Chol/HDL Ratio: 7.5 ratio — ABNORMAL HIGH (ref 0.0–5.0)
HDL: 31 mg/dL — ABNORMAL LOW (ref >39)
LDL Chol Calc (NIH): 178 mg/dL — ABNORMAL HIGH (ref 0–99)
Triglycerides: 130 mg/dL (ref 0–149)
VLDL Cholesterol Cal: 24 mg/dL (ref 5–40)

## 2020-08-03 ENCOUNTER — Other Ambulatory Visit: Payer: Self-pay

## 2020-08-03 ENCOUNTER — Encounter: Payer: Self-pay | Admitting: Family Medicine

## 2020-08-03 ENCOUNTER — Ambulatory Visit: Payer: BC Managed Care – PPO | Admitting: Family Medicine

## 2020-08-03 VITALS — BP 113/72 | HR 72 | Ht 68.0 in | Wt 185.0 lb

## 2020-08-03 DIAGNOSIS — Z0001 Encounter for general adult medical examination with abnormal findings: Secondary | ICD-10-CM

## 2020-08-03 DIAGNOSIS — E559 Vitamin D deficiency, unspecified: Secondary | ICD-10-CM | POA: Diagnosis not present

## 2020-08-03 DIAGNOSIS — E785 Hyperlipidemia, unspecified: Secondary | ICD-10-CM | POA: Diagnosis not present

## 2020-08-03 DIAGNOSIS — Z0000 Encounter for general adult medical examination without abnormal findings: Secondary | ICD-10-CM

## 2020-08-03 MED ORDER — VITAMIN D3 125 MCG (5000 UT) PO CAPS: 5000.0000 [IU] | ORAL_CAPSULE | Freq: Every day | ORAL | 3 refills | Status: DC

## 2020-08-03 NOTE — Progress Notes (Signed)
BP 113/72   Pulse 72   Ht _0  (1.727 m)   Wt 185 lb (83.9 kg)   SpO2 98%   BMI 28.13 kg/m    Subjective:   Patient ID: Kurt Bowen, male    DOB: [birth year], 50 y.o.   MRN: [id number]  HPI: Kurt Bowen is a 50 y.o. male presenting on 08/03/2020 for No chief complaint on file.   HPI Adult well exam and physical Patient denies any chest pain, shortness of breath, headaches or vision issues, abdominal complaints, diarrhea, nausea, vomiting, or joint issues.  Patient had 1 issue where he ate a lot after the end of Ramadan and felt short of breath and tightness in his chest but has not had it since.  Patient has a history of low vitamin D and wants to take vitamin D.  Relevant past medical, surgical, family and social history reviewed and updated as indicated. Interim medical history since our last visit reviewed. Allergies and medications reviewed and updated.  Review of Systems  Constitutional: Negative for chills and fever.  Eyes: Negative for visual disturbance.  Respiratory: Negative for shortness of breath and wheezing.   Cardiovascular: Negative for chest pain and leg swelling.  Musculoskeletal: Negative for back pain and gait problem.  Skin: Negative for rash.  Neurological: Negative for dizziness, weakness and light-headedness.  All other systems reviewed and are negative.   Per HPI unless specifically indicated above   Allergies as of 08/03/2020   No Known Allergies     Medication List    as of Aug 03, 2020 10:32 AM   You have not been prescribed any medications.      Objective:   BP 113/72   Pulse 72   Ht _1  (1.727 m)   Wt 185 lb (83.9 kg)   SpO2 98%   BMI 28.13 kg/m   Wt Readings from Last 3 Encounters:  08/03/20 185 lb (83.9 kg)  07/26/20 179 lb (81.2 kg)  09/24/18 181 lb (82.1 kg)    Physical Exam Vitals and nursing note reviewed.  Constitutional:      General: He is not in acute distress.    Appearance: He is well-developed.  He is not diaphoretic.  Eyes:     General: No scleral icterus.    Conjunctiva/sclera: Conjunctivae normal.  Neck:     Thyroid: No thyromegaly.  Cardiovascular:     Rate and Rhythm: Normal rate and regular rhythm.     Heart sounds: Normal heart sounds. No murmur heard.   Pulmonary:     Effort: Pulmonary effort is normal. No respiratory distress.     Breath sounds: Normal breath sounds. No wheezing.  Musculoskeletal:        General: Normal range of motion.     Cervical back: Neck supple.  Lymphadenopathy:     Cervical: No cervical adenopathy.  Skin:    General: Skin is warm and dry.     Findings: No rash.  Neurological:     Mental Status: He is alert and oriented to person, place, and time.     Coordination: Coordination normal.  Psychiatric:        Behavior: Behavior normal.     Results for orders placed or performed in visit on 07/26/20  CBC with Differential  Result Value Ref Range   WBC 4.6 3.4 - 10.8 x10E3/uL   RBC 5.29 4.14 - 5.80 x10E6/uL   Hemoglobin 15.7 13.0 - 17.7 g/dL   Hematocrit 46.2 37.5 - 51.0 %  MCV 87 79 - 97 fL   MCH 29.7 26.6 - 33.0 pg   MCHC 34.0 31.5 - 35.7 g/dL   RDW 13.0 11.6 - 15.4 %   Platelets 266 150 - 450 x10E3/uL   Neutrophils 51 Not Estab. %   Lymphs 37 Not Estab. %   Monocytes 9 Not Estab. %   Eos 2 Not Estab. %   Basos 1 Not Estab. %   Neutrophils Absolute 2.4 1.4 - 7.0 x10E3/uL   Lymphocytes Absolute 1.7 0.7 - 3.1 x10E3/uL   Monocytes Absolute 0.4 0.1 - 0.9 x10E3/uL   EOS (ABSOLUTE) 0.1 0.0 - 0.4 x10E3/uL   Basophils Absolute 0.0 0.0 - 0.2 x10E3/uL   Immature Granulocytes 0 Not Estab. %   Immature Grans (Abs) 0.0 0.0 - 0.1 x10E3/uL  Comprehensive metabolic panel  Result Value Ref Range   Glucose 95 65 - 99 mg/dL   BUN 11 6 - 24 mg/dL   Creatinine, Ser 0.98 0.76 - 1.27 mg/dL   eGFR 95 >59 mL/min/1.73   BUN/Creatinine Ratio 11 9 - 20   Sodium 140 134 - 144 mmol/L   Potassium 4.6 3.5 - 5.2 mmol/L   Chloride 106 96 - 106  mmol/L   CO2 20 20 - 29 mmol/L   Calcium 9.7 8.7 - 10.2 mg/dL   Total Protein 7.1 6.0 - 8.5 g/dL   Albumin 4.8 4.0 - 5.0 g/dL   Globulin, Total 2.3 1.5 - 4.5 g/dL   Albumin/Globulin Ratio 2.1 1.2 - 2.2   Bilirubin Total 0.5 0.0 - 1.2 mg/dL   Alkaline Phosphatase 90 44 - 121 IU/L   AST 19 0 - 40 IU/L   ALT 31 0 - 44 IU/L  Lipid Panel  Result Value Ref Range   Cholesterol, Total 233 (H) 100 - 199 mg/dL   Triglycerides 130 0 - 149 mg/dL   HDL 31 (L) >39 mg/dL   VLDL Cholesterol Cal 24 5 - 40 mg/dL   LDL Chol Calc (NIH) 178 (H) 0 - 99 mg/dL   Chol/HDL Ratio 7.5 (H) 0.0 - 5.0 ratio  Troponin T  Result Value Ref Range   Troponin T (Highly Sensitive) <6 0 - 22 ng/L    Assessment & Plan:   Problem List Items Addressed This Visit      Other   Hyperlipidemia with target LDL less than 100 (Chronic)   Relevant Orders   Ambulatory referral to Cardiology    Other Visit Diagnoses    Physical exam, annual    -  Primary   Vitamin D deficiency       Relevant Medications   Cholecalciferol (VITAMIN D3) 125 MCG (5000 UT) CAPS      Referral to cardiology for possible screening, he is concerned because of his cholesterol blockages.  Return in 6 months for cholesterol recheck, wants to do diet and exercise first. Follow up plan: Return in about 6 months (around 02/03/2021), or if symptoms worsen or fail to improve, for hyperlipidemia.  Counseling provided for all of the vaccine components No orders of the defined types were placed in this encounter.   Caryl Pina, MD [facility] 08/03/2020, 10:32 AM

## 2020-09-08 ENCOUNTER — Ambulatory Visit (INDEPENDENT_AMBULATORY_CARE_PROVIDER_SITE_OTHER): Payer: BC Managed Care – PPO | Admitting: Cardiology

## 2020-09-08 ENCOUNTER — Other Ambulatory Visit: Payer: Self-pay

## 2020-09-08 ENCOUNTER — Encounter: Payer: Self-pay | Admitting: Cardiology

## 2020-09-08 VITALS — BP 123/82 | HR 72 | Ht 69.0 in | Wt 177.0 lb

## 2020-09-08 DIAGNOSIS — R072 Precordial pain: Secondary | ICD-10-CM | POA: Insufficient documentation

## 2020-09-08 DIAGNOSIS — E785 Hyperlipidemia, unspecified: Secondary | ICD-10-CM

## 2020-09-08 NOTE — Progress Notes (Signed)
Cardiology Office Note   Date:  09/08/2020   ID:  Kurt Bowen, DOB 12/20/1970, MRN [id number]  PCP:  Dettinger, Elige Radon, MD  Cardiologist:   Rollene Rotunda, MD Referring:  Dettinger, Elige Radon, MD  Chief Complaint  Patient presents with   Chest Pain       History of Present Illness: Kurt Bowen is a 50 y.o. male who presents for evaluation of chest pain.    He was referred by Dettinger, Elige Radon, MD  I saw him several years ago when he had some bradycardia at during a sleep study.  He was not found to have sleep apnea.  He was never bothered by any dysrhythmias and has not had presyncope or syncope.  The only time he would notice palpitations now is if he eats greasy meal.  He typically eats a Turkey diet but when he goes off this he might feel his heart skipping.  He has significant episode of chest discomfort on May 3 associated with presyncope after he had come off of fast and was gorging himself with unhealthy foods.  He felt some pain passed through his chest.  He actually called EMS.  He was not found to have any significant abnormalities and did not need transport.  He saw his primary care doctor the next day and I reviewed these records.  There was no evidence of any acute EKG changes.  He has not had any symptoms since then.  He does not have any new shortness of breath, PND or orthopnea.  Is not had any new weight gain or edema.  His wife just had a baby yesterday.     Past Medical History:  Diagnosis Date   GERD (gastroesophageal reflux disease)    Hyperlipidemia    Ulcer     Past Surgical History:  Procedure Laterality Date   FOOT SURGERY     left     Current Outpatient Medications  Medication Sig Dispense Refill   Cholecalciferol (VITAMIN D3) 125 MCG (5000 UT) CAPS Take 1 capsule (5,000 Units total) by mouth daily. 90 capsule 3   No current facility-administered medications for this visit.    Allergies:   Patient has no known allergies.     Social History:  The patient  reports that he quit smoking about 14 years ago. His smoking use included cigarettes. He has never used smokeless tobacco. He reports that he does not drink alcohol and does not use drugs.   He works Secretary/administrator accidents.  He was born in [place].  He now has 3 daughters.  Family History:  The patient's family history includes Hyperlipidemia in his father.    ROS:  Please see the history of present illness.   Otherwise, review of systems are positive for none.   All other systems are reviewed and negative.    PHYSICAL EXAM: VS:  BP 123/82   Pulse 72   Ht 5\' 9"  (1.753 m)   Wt 177 lb (80.3 kg)   BMI 26.14 kg/m  , BMI Body mass index is 26.14 kg/m. GENERAL:  Well appearing HEENT:  Pupils equal round and reactive, fundi not visualized, oral mucosa unremarkable NECK:  No jugular venous distention, waveform within normal limits, carotid upstroke brisk and symmetric, no bruits, no thyromegaly LYMPHATICS:  No cervical, inguinal adenopathy LUNGS:  Clear to auscultation bilaterally BACK:  No CVA tenderness CHEST:  Unremarkable HEART:  PMI not displaced or sustained,S1 and S2 within normal limits, no S3, no S4,  no clicks, no rubs, no murmurs ABD:  Flat, positive bowel sounds normal in frequency in pitch, no bruits, no rebound, no guarding, no midline pulsatile mass, no hepatomegaly, no splenomegaly EXT:  2 plus pulses throughout, no edema, no cyanosis no clubbing SKIN:  No rashes no nodules NEURO:  Cranial nerves II through XII grossly intact, motor grossly intact throughout PSYCH:  Cognitively intact, oriented to person place and time    EKG:  EKG is ordered today. The ekg ordered today demonstrates sinus rhythm, rate 72, axis within normal limits, intervals within normal limits, lateral T wave inversions without old EKGs for comparison.   Recent Labs: 07/26/2020: ALT 31; BUN 11; Creatinine, Ser 0.98; Hemoglobin 15.7; Platelets 266; Potassium  4.6; Sodium 140    Lipid Panel    Component Value Date/Time   CHOL 233 (H) 07/26/2020 1052   CHOL 167 06/20/2012 0957   TRIG 130 07/26/2020 1052   TRIG 104 01/06/2013 0841   TRIG 78 06/20/2012 0957   HDL 31 (L) 07/26/2020 1052   HDL 33 (L) 01/06/2013 0841   HDL 35 (L) 06/20/2012 0957   CHOLHDL 7.5 (H) 07/26/2020 1052   LDLCALC 178 (H) 07/26/2020 1052   LDLCALC 115 (H) 01/06/2013 0841   LDLCALC 116 (H) 06/20/2012 0957      Wt Readings from Last 3 Encounters:  09/08/20 177 lb (80.3 kg)  08/03/20 185 lb (83.9 kg)  07/26/20 179 lb (81.2 kg)      Other studies Reviewed: Additional studies/ records that were reviewed today include: sleep study. Review of the above records demonstrates:  Please see elsewhere in the note.     ASSESSMENT AND PLAN:  CHEST PAIN: This chest pain was somewhat unusual and atypical for angina.  I suspect nonanginal but he does have some risk factors with dyslipidemia.  I would like to screen him with a coronary calcium score.  Further testing will be based on these results.  This will help also determine goals of therapy.   DYSLIPIDEMIA: He really wants to avoid medications.  His LDL was 178 earlier this month.  His HDL was 31.  Goals of therapy will be based on the results of the calcium score.   Current medicines are reviewed at length with the patient today.  The patient does not have concerns regarding medicines.  The following changes have been made:  no change  Labs/ tests ordered today include:   Orders Placed This Encounter  Procedures   CT CARDIAC SCORING (SELF PAY ONLY)     Disposition:   FU with me based on the results of the above   Signed, Rollene Rotunda, MD  09/08/2020 3:28 PM    [facility]

## 2020-09-08 NOTE — Patient Instructions (Addendum)
Medication Instructions:  Your physician recommends that you continue on your current medications as directed. Please refer to the Current Medication list given to you today.  Labwork: none  Testing/Procedures: Calcium Scoring Test  Follow-Up: Your physician recommends that you schedule a follow-up appointment in: as needed.  Any Other Special Instructions Will Be Listed Below (If Applicable).  If you need a refill on your cardiac medications before your next appointment, please call your pharmacy. 

## 2020-09-15 ENCOUNTER — Ambulatory Visit: Payer: Medicaid Other | Admitting: Cardiology

## 2020-10-12 ENCOUNTER — Inpatient Hospital Stay: Admission: RE | Admit: 2020-10-12 | Payer: Medicaid Other | Source: Ambulatory Visit

## 2020-10-12 ENCOUNTER — Other Ambulatory Visit: Payer: Medicaid Other

## 2021-02-03 ENCOUNTER — Ambulatory Visit: Payer: Medicaid Other | Admitting: Family Medicine

## 2022-01-22 DIAGNOSIS — J90 Pleural effusion, not elsewhere classified: Secondary | ICD-10-CM | POA: Diagnosis not present

## 2022-01-22 DIAGNOSIS — S20212A Contusion of left front wall of thorax, initial encounter: Secondary | ICD-10-CM | POA: Diagnosis not present

## 2022-01-22 DIAGNOSIS — R0781 Pleurodynia: Secondary | ICD-10-CM | POA: Diagnosis not present

## 2022-01-30 ENCOUNTER — Ambulatory Visit (INDEPENDENT_AMBULATORY_CARE_PROVIDER_SITE_OTHER): Payer: BC Managed Care – PPO

## 2022-01-30 ENCOUNTER — Encounter: Payer: Self-pay | Admitting: Nurse Practitioner

## 2022-01-30 ENCOUNTER — Ambulatory Visit (INDEPENDENT_AMBULATORY_CARE_PROVIDER_SITE_OTHER): Payer: BC Managed Care – PPO | Admitting: Nurse Practitioner

## 2022-01-30 VITALS — BP 123/81 | HR 89 | Temp 98.4°F | Ht 69.0 in | Wt 191.0 lb

## 2022-01-30 DIAGNOSIS — W19XXXA Unspecified fall, initial encounter: Secondary | ICD-10-CM

## 2022-01-30 DIAGNOSIS — R0781 Pleurodynia: Secondary | ICD-10-CM

## 2022-01-30 DIAGNOSIS — Z9181 History of falling: Secondary | ICD-10-CM

## 2022-01-30 NOTE — Progress Notes (Signed)
   Subjective:    Patient ID: Kurt Bowen, male    DOB: [birth year], 51 y.o.   MRN: [id number]   Chief Complaint: Rib Injury (Left sided. [facility] last week, xray [facility])   HPI Pt seen today for L rib pain after a fall last Friday when he slipped and hit his L side on the edge of the bathtub. He was seen at outside UC the next day; per chart review XR at that time showed mild blunting of the L costophrenic angle, atelectasis +/- small pleural effusion at L lung base, but no acute fracture or lesion. Pt still having pain, especially when laying flat, moving, coughing, laughing, sneezing. No sob. Rates pain 6/10 at rest; 10/10 with movement, coughing, laying flat.  Pt works for [employer] and is not able to perform his job safely at this time due to pain.   Review of Systems  Constitutional:  Negative for fatigue.  Respiratory:  Negative for chest tightness and shortness of breath.   Cardiovascular:  Negative for chest pain.  Musculoskeletal:        L rib pain  All other systems reviewed and are negative.      Objective:   Physical Exam Vitals and nursing note reviewed.  Constitutional:      General: He is not in acute distress.    Appearance: Normal appearance.  Cardiovascular:     Rate and Rhythm: Normal rate and regular rhythm.  Pulmonary:     Effort: Pulmonary effort is normal. No respiratory distress.     Breath sounds: Normal breath sounds. No wheezing, rhonchi or rales.  Chest:     Chest wall: Tenderness present. No deformity, swelling or crepitus.     Comments: Tenderness over L rib cage Musculoskeletal:     Comments: Tenderness L rib cage  Skin:    Findings: No bruising or lesion.  Neurological:     General: No focal deficit present.     Mental Status: He is alert and oriented to person, place, and time.  Psychiatric:        Mood and Affect: Mood normal.        Behavior: Behavior normal.     BP 123/81   Pulse 89   Temp 98.4 F (36.9 C)    Ht 5\' 9"  (1.753 m)   Wt 191 lb (86.6 kg)   SpO2 98%   BMI 28.21 kg/m        Assessment & Plan:   Kurt Bowen in today with chief complaint of Rib Injury (Left sided. [facility] last week, xray [facility])   1. Fall, initial encounter 2. Rib pain XR: no obvious fracture Cough and deep breathe every 2 hours while splinting. Continue Ibuprofen as directed Out of work until at least Monday Return for any new or worsening symptoms or if symptoms fail to improve. - DG Chest 2 View; Future    The above assessment and management plan was discussed with the patient. The patient verbalized understanding of and has agreed to the management plan. Patient is aware to call the clinic if symptoms persist or worsen. Patient is aware when to return to the clinic for a follow-up visit. Patient educated on when it is appropriate to go to the emergency department.   Collene Leyden, FNP student  [place], [place]

## 2022-01-30 NOTE — Patient Instructions (Addendum)
Rib Contusion A rib contusion is a deep bruise on the rib area. Contusions are the result of a blunt trauma that causes bleeding and injury to the tissues under the skin. A rib contusion may involve bruising of the ribs and of the skin and muscles in the area. The skin over the contusion may turn blue, purple, or yellow. Minor injuries result in a painless contusion. More severe contusions may be painful and swollen for a few weeks. What are the causes? This condition is usually caused by a hard, direct hit to an area of the body. This often occurs while playing contact sports. What are the signs or symptoms? Symptoms of this condition include: Swelling and redness of the injured area. Discoloration of the injured area. Tenderness and soreness of the injured area. Pain with or without movement. Pain when breathing in. How is this diagnosed? This condition may be diagnosed based on: Your symptoms and medical history. A physical exam. Imaging tests--such as an X-ray, CT scan, or MRI--to determine if there were internal injuries or broken bones (fractures). How is this treated? This condition may be treated with: Rest. This is often the best treatment for a rib contusion. Ice packs. This reduces swelling and inflammation. Deep-breathing exercises. These may be recommended to reduce the risk for lung collapse and pneumonia. Medicines. Over-the-counter or prescription medicines may be given to control pain. Injection of a numbing medicine around the nerve near your injury (nerve block). Follow these instructions at home: Medicines Take over-the-counter and prescription medicines only as told by your health care provider. Ask your health care provider if the medicine prescribed to you: Requires you to avoid driving or using machinery. Can cause constipation. You may need to take these actions to prevent or treat constipation: Drink enough fluid to keep your urine pale yellow. Take  over-the-counter or prescription medicines. Eat foods that are high in fiber, such as beans, whole grains, and fresh fruits and vegetables. Limit foods that are high in fat and processed sugars, such as fried or sweet foods. Managing pain, stiffness, and swelling If directed, put ice on the injured area. To do this: Put ice in a plastic bag. Place a towel between your skin and the bag. Leave the ice on for 20 minutes, 2-3 times a day. Remove the ice if your skin turns bright red. This is very important. If you cannot feel pain, heat, or cold, you have a greater risk of damage to the area.  Activity Rest the injured area. Avoid strenuous activity and any activities or movements that cause pain. Be careful during activities, and avoid bumping the injured area. Do not lift anything that is heavier than 5 lb (2.3 kg), or the limit that you are told, until your health care provider says that it is safe. General instructions  Do not use any products that contain nicotine or tobacco, such as cigarettes, e-cigarettes, and chewing tobacco. These can delay healing. If you need help quitting, ask your health care provider. Do deep-breathing exercises as told by your health care provider. If you were given an incentive spirometer, use it every 1-2 hours while you are awake, or as recommended by your health care provider. This device measures how well you are filling your lungs with each breath. Keep all follow-up visits. This is important. Contact a health care provider if you have: Increased bruising or swelling. Pain that is not controlled with treatment. A fever. Get help right away if you: Have difficulty breathing or   shortness of breath. Develop a continual cough, or you cough up thick or bloody mucus from your lungs (sputum). Feel nauseous or you vomit. Have pain in your abdomen. These symptoms may represent a serious problem that is an emergency. Do not wait to see if the symptoms will go  away. Get medical help right away. Call your local emergency services (911 in the [place] Do not drive yourself to the hospital. Summary A rib contusion is a deep bruise on your rib area. Contusions are the result of a blunt trauma that causes bleeding and injury to the tissues under the skin. The skin over the contusion may turn blue, purple, or yellow. Minor injuries may cause a painless contusion. More severe contusions may be painful and swollen for a few weeks. Rest the injured area. Avoid strenuous activity and any activities or movements that cause pain. This information is not intended to replace advice given to you by your health care provider. Make sure you discuss any questions you have with your health care provider. Document Revised: 06/17/2019 Document Reviewed: 06/17/2019 [employer] Patient Education  2023 [employer]  

## 2022-02-06 ENCOUNTER — Telehealth: Payer: Self-pay | Admitting: Family Medicine

## 2022-02-06 NOTE — Telephone Encounter (Signed)
Ok to extend work note 

## 2022-02-06 NOTE — Telephone Encounter (Signed)
Calling to check on work note and told patient MMM ok'd it but it hadn't been done. Please call.

## 2022-02-06 NOTE — Telephone Encounter (Signed)
Note written and left up front for patient pick up. Patient notified and verbalized understanding

## 2022-02-06 NOTE — Telephone Encounter (Signed)
Okay to extend for a week?

## 2022-11-26 DIAGNOSIS — U071 COVID-19: Secondary | ICD-10-CM | POA: Diagnosis not present

## 2023-09-29 DIAGNOSIS — K760 Fatty (change of) liver, not elsewhere classified: Secondary | ICD-10-CM | POA: Diagnosis not present

## 2023-09-29 DIAGNOSIS — L299 Pruritus, unspecified: Secondary | ICD-10-CM | POA: Diagnosis not present

## 2023-09-29 DIAGNOSIS — R1013 Epigastric pain: Secondary | ICD-10-CM | POA: Diagnosis not present

## 2023-09-29 DIAGNOSIS — M545 Low back pain, unspecified: Secondary | ICD-10-CM | POA: Diagnosis not present

## 2023-09-29 DIAGNOSIS — N433 Hydrocele, unspecified: Secondary | ICD-10-CM | POA: Diagnosis not present

## 2023-09-29 DIAGNOSIS — R21 Rash and other nonspecific skin eruption: Secondary | ICD-10-CM | POA: Diagnosis not present

## 2023-10-02 DIAGNOSIS — Z20822 Contact with and (suspected) exposure to covid-19: Secondary | ICD-10-CM | POA: Diagnosis not present

## 2023-10-02 DIAGNOSIS — R051 Acute cough: Secondary | ICD-10-CM | POA: Diagnosis not present

## 2023-10-02 DIAGNOSIS — J069 Acute upper respiratory infection, unspecified: Secondary | ICD-10-CM | POA: Diagnosis not present

## 2023-10-02 DIAGNOSIS — R070 Pain in throat: Secondary | ICD-10-CM | POA: Diagnosis not present
